# Patient Record
Sex: Female | Born: 1960 | Race: Black or African American | Hispanic: No | Smoking: Never smoker
Health system: Southern US, Community
[De-identification: ages and names within clinical notes are randomized; demographics above are authoritative.]

## PROBLEM LIST (undated history)

## (undated) DIAGNOSIS — F32A Depression, unspecified: Secondary | ICD-10-CM

## (undated) DIAGNOSIS — F329 Major depressive disorder, single episode, unspecified: Secondary | ICD-10-CM

## (undated) DIAGNOSIS — I10 Essential (primary) hypertension: Secondary | ICD-10-CM

## (undated) DIAGNOSIS — K219 Gastro-esophageal reflux disease without esophagitis: Secondary | ICD-10-CM

## (undated) HISTORY — PX: CHOLECYSTECTOMY OPEN: SUR202

---

## 2008-10-17 ENCOUNTER — Ambulatory Visit: Payer: Self-pay | Admitting: Internal Medicine

## 2009-11-14 ENCOUNTER — Ambulatory Visit: Payer: Self-pay | Admitting: Internal Medicine

## 2011-12-06 DIAGNOSIS — G4733 Obstructive sleep apnea (adult) (pediatric): Secondary | ICD-10-CM | POA: Insufficient documentation

## 2011-12-06 DIAGNOSIS — I10 Essential (primary) hypertension: Secondary | ICD-10-CM | POA: Insufficient documentation

## 2011-12-06 DIAGNOSIS — E669 Obesity, unspecified: Secondary | ICD-10-CM | POA: Insufficient documentation

## 2011-12-06 DIAGNOSIS — Z008 Encounter for other general examination: Secondary | ICD-10-CM | POA: Insufficient documentation

## 2011-12-06 DIAGNOSIS — Z1231 Encounter for screening mammogram for malignant neoplasm of breast: Secondary | ICD-10-CM | POA: Insufficient documentation

## 2011-12-10 DIAGNOSIS — E559 Vitamin D deficiency, unspecified: Secondary | ICD-10-CM | POA: Insufficient documentation

## 2012-06-05 ENCOUNTER — Ambulatory Visit: Payer: Self-pay | Admitting: Family Medicine

## 2013-01-30 DIAGNOSIS — F418 Other specified anxiety disorders: Secondary | ICD-10-CM | POA: Insufficient documentation

## 2014-02-19 DIAGNOSIS — J0100 Acute maxillary sinusitis, unspecified: Secondary | ICD-10-CM | POA: Insufficient documentation

## 2014-03-21 ENCOUNTER — Ambulatory Visit: Payer: Self-pay | Admitting: Family Medicine

## 2014-08-21 DIAGNOSIS — J309 Allergic rhinitis, unspecified: Secondary | ICD-10-CM | POA: Insufficient documentation

## 2015-07-03 DIAGNOSIS — D649 Anemia, unspecified: Secondary | ICD-10-CM | POA: Insufficient documentation

## 2015-07-03 DIAGNOSIS — R7303 Prediabetes: Secondary | ICD-10-CM | POA: Insufficient documentation

## 2015-07-03 DIAGNOSIS — R6889 Other general symptoms and signs: Secondary | ICD-10-CM | POA: Insufficient documentation

## 2015-08-07 DIAGNOSIS — M25561 Pain in right knee: Secondary | ICD-10-CM | POA: Insufficient documentation

## 2015-08-15 DIAGNOSIS — R195 Other fecal abnormalities: Secondary | ICD-10-CM | POA: Insufficient documentation

## 2015-08-25 ENCOUNTER — Other Ambulatory Visit: Payer: Self-pay | Admitting: Family Medicine

## 2015-08-25 DIAGNOSIS — Z1231 Encounter for screening mammogram for malignant neoplasm of breast: Secondary | ICD-10-CM

## 2015-09-04 ENCOUNTER — Ambulatory Visit
Admission: RE | Admit: 2015-09-04 | Discharge: 2015-09-04 | Disposition: A | Discharge status: Home / Self Care | Payer: BLUE CROSS/BLUE SHIELD | Source: Ambulatory Visit | Attending: Family Medicine | Admitting: Family Medicine

## 2015-09-04 DIAGNOSIS — Z1231 Encounter for screening mammogram for malignant neoplasm of breast: Secondary | ICD-10-CM | POA: Diagnosis present

## 2015-09-11 ENCOUNTER — Ambulatory Visit: Payer: BLUE CROSS/BLUE SHIELD | Attending: Internal Medicine

## 2015-09-11 DIAGNOSIS — G4733 Obstructive sleep apnea (adult) (pediatric): Secondary | ICD-10-CM | POA: Insufficient documentation

## 2015-10-17 ENCOUNTER — Ambulatory Visit: Admit: 2015-10-17 | Payer: BLUE CROSS/BLUE SHIELD | Admitting: Gastroenterology

## 2015-10-17 SURGERY — COLONOSCOPY WITH PROPOFOL
Anesthesia: General

## 2015-11-26 ENCOUNTER — Other Ambulatory Visit: Payer: Self-pay | Admitting: Student

## 2015-11-26 DIAGNOSIS — M5416 Radiculopathy, lumbar region: Secondary | ICD-10-CM

## 2015-12-13 ENCOUNTER — Ambulatory Visit
Admission: RE | Admit: 2015-12-13 | Discharge: 2015-12-13 | Disposition: A | Discharge status: Home / Self Care | Payer: BLUE CROSS/BLUE SHIELD | Source: Ambulatory Visit | Attending: Student | Admitting: Student

## 2015-12-13 DIAGNOSIS — M5416 Radiculopathy, lumbar region: Secondary | ICD-10-CM | POA: Diagnosis not present

## 2015-12-13 DIAGNOSIS — M5126 Other intervertebral disc displacement, lumbar region: Secondary | ICD-10-CM | POA: Insufficient documentation

## 2015-12-13 DIAGNOSIS — M2578 Osteophyte, vertebrae: Secondary | ICD-10-CM | POA: Insufficient documentation

## 2015-12-13 DIAGNOSIS — M1288 Other specific arthropathies, not elsewhere classified, other specified site: Secondary | ICD-10-CM | POA: Insufficient documentation

## 2015-12-13 DIAGNOSIS — M5136 Other intervertebral disc degeneration, lumbar region: Secondary | ICD-10-CM | POA: Diagnosis not present

## 2015-12-13 DIAGNOSIS — M4806 Spinal stenosis, lumbar region: Secondary | ICD-10-CM | POA: Insufficient documentation

## 2016-01-08 ENCOUNTER — Encounter: Payer: Self-pay | Admitting: *Deleted

## 2016-01-09 ENCOUNTER — Encounter: Payer: Self-pay | Admitting: *Deleted

## 2016-01-09 ENCOUNTER — Encounter
Admission: RE | Disposition: A | Discharge status: Home / Self Care | Payer: Self-pay | Source: Ambulatory Visit | Attending: Gastroenterology

## 2016-01-09 ENCOUNTER — Ambulatory Visit: Payer: BLUE CROSS/BLUE SHIELD | Admitting: Anesthesiology

## 2016-01-09 ENCOUNTER — Ambulatory Visit
Admission: RE | Admit: 2016-01-09 | Discharge: 2016-01-09 | Disposition: A | Discharge status: Home / Self Care | Payer: BLUE CROSS/BLUE SHIELD | Source: Ambulatory Visit | Attending: Gastroenterology | Admitting: Gastroenterology

## 2016-01-09 DIAGNOSIS — I10 Essential (primary) hypertension: Secondary | ICD-10-CM | POA: Insufficient documentation

## 2016-01-09 DIAGNOSIS — K921 Melena: Secondary | ICD-10-CM | POA: Diagnosis not present

## 2016-01-09 DIAGNOSIS — F329 Major depressive disorder, single episode, unspecified: Secondary | ICD-10-CM | POA: Insufficient documentation

## 2016-01-09 DIAGNOSIS — D509 Iron deficiency anemia, unspecified: Secondary | ICD-10-CM | POA: Diagnosis not present

## 2016-01-09 DIAGNOSIS — K219 Gastro-esophageal reflux disease without esophagitis: Secondary | ICD-10-CM | POA: Diagnosis not present

## 2016-01-09 DIAGNOSIS — Z5309 Procedure and treatment not carried out because of other contraindication: Secondary | ICD-10-CM | POA: Insufficient documentation

## 2016-01-09 HISTORY — PX: ESOPHAGOGASTRODUODENOSCOPY (EGD) WITH PROPOFOL: SHX5813

## 2016-01-09 HISTORY — DX: Depression, unspecified: F32.A

## 2016-01-09 HISTORY — PX: COLONOSCOPY WITH PROPOFOL: SHX5780

## 2016-01-09 HISTORY — DX: Gastro-esophageal reflux disease without esophagitis: K21.9

## 2016-01-09 HISTORY — DX: Essential (primary) hypertension: I10

## 2016-01-09 HISTORY — DX: Major depressive disorder, single episode, unspecified: F32.9

## 2016-01-09 SURGERY — COLONOSCOPY WITH PROPOFOL
Anesthesia: General

## 2016-01-09 MED ORDER — SODIUM CHLORIDE 0.9 % IV SOLN
INTRAVENOUS | Status: DC
  Administered 2016-01-09: 1000 mL via INTRAVENOUS

## 2016-01-09 MED ORDER — FENTANYL CITRATE (PF) 100 MCG/2ML IJ SOLN
INTRAMUSCULAR | Status: DC | PRN
  Administered 2016-01-09: 50 ug via INTRAVENOUS

## 2016-01-09 MED ORDER — PROPOFOL 10 MG/ML IV BOLUS
INTRAVENOUS | Status: DC | PRN
  Administered 2016-01-09: 20 mg via INTRAVENOUS
  Administered 2016-01-09: 30 mg via INTRAVENOUS
  Administered 2016-01-09: 20 mg via INTRAVENOUS

## 2016-01-09 MED ORDER — SODIUM CHLORIDE 0.9 % IV SOLN: INTRAVENOUS | Status: DC

## 2016-01-09 MED ORDER — MIDAZOLAM HCL 2 MG/2ML IJ SOLN
INTRAMUSCULAR | Status: DC | PRN
  Administered 2016-01-09: 1 mg via INTRAVENOUS

## 2016-01-09 NOTE — H&P (Addendum)
Outpatient short stay form Pre-procedure 01/09/2016 1:41 PM Sabrina Sails MD  Primary Physician: [facility] family [facility], Dr. Marygrace Drought  Reason for visit:  EGD and colonoscopy  History of present illness:  Patient is a 55 year old female presenting today as above. She has a history of anemia that has been long term however she has noted increased anemia as well as Hemoccult-positive stool after recently increasing NSAID use due to back pain. She has not had a screening colonoscopy in the past. He tolerated her prep well. She takes no aspirin products. She has been taking quite a bit of NSAID recently both diclofenac as well as Naprosyn and currently. She was previously not taking a proton pump inhibitor.  She tolerated her prep well.    Current Facility-Administered Medications:  .  0.9 %  sodium chloride infusion, , Intravenous, Continuous, Sabrina Sails, MD, Last Rate: 20 mL/hr at 01/09/16 1254, 1,000 mL at 01/09/16 1254 .  0.9 %  sodium chloride infusion, , Intravenous, Continuous, Sabrina Sails, MD  Prescriptions Prior to Admission  Medication Sig Dispense Refill Last Dose  . diclofenac (VOLTAREN) 75 MG EC tablet Take 75 mg by mouth 2 (two) times daily.   Past Week at Unknown time  . ergocalciferol (VITAMIN D2) 50000 units capsule Take 50,000 Units by mouth once a week.   Past Week at Unknown time  . ferrous sulfate 325 (65 FE) MG tablet Take 325 mg by mouth daily with breakfast.   Past Week at Unknown time  . guaiFENesin-codeine 100-10 MG/5ML syrup Take 5 mLs by mouth every 6 (six) hours as needed for cough.     Marland Kitchen lisinopril-hydrochlorothiazide (PRINZIDE,ZESTORETIC) 10-12.5 MG tablet Take 1 tablet by mouth daily.   Past Week at Unknown time  . nortriptyline (PAMELOR) 25 MG capsule Take 25 mg by mouth at bedtime.   Past Week at Unknown time  . omeprazole (PRILOSEC) 20 MG capsule Take 20 mg by mouth daily.   Past Week at Unknown time  . polyethylene  glycol-electrolytes (NULYTELY/GOLYTELY) 420 g solution Take 4,000 mLs by mouth once.   01/08/2016 at Unknown time  . sertraline (ZOLOFT) 100 MG tablet Take 100 mg by mouth daily.   Past Week at Unknown time  . naproxen (NAPROSYN) 500 MG tablet Take 500 mg by mouth 2 (two) times daily with a meal.   Not Taking at Unknown time     No Known Allergies   Past Medical History:  Diagnosis Date  . Depression   . GERD (gastroesophageal reflux disease)   . Hypertension     Review of systems:      Physical Exam    Heart and lungs: Regular rate and rhythm without rub or gallop, lungs are bilaterally clear.    HEENT: Normocephalic atraumatic eyes are anicteric    Other:     Pertinant exam for procedure: Soft nontender nondistended bowel sounds positive normoactive.    Planned proceedures: EGD and colonoscopy with indicated procedures. I have discussed the risks benefits and complications of procedures to include not limited to bleeding, infection, perforation and the risk of sedation and the patient wishes to proceed.    Sabrina Sails, MD Gastroenterology 01/09/2016  1:41 PM    Addendum to H&P for procedure today. 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

## 2016-01-09 NOTE — Transfer of Care (Signed)
Immediate Anesthesia Transfer of Care Note  Patient: Sabrina Castro  Procedure(s) Performed: Procedure(s): COLONOSCOPY WITH PROPOFOL (N/A) ESOPHAGOGASTRODUODENOSCOPY (EGD) WITH PROPOFOL (N/A)  Patient Location: PACU  Anesthesia Type:General  Level of Consciousness: awake  Airway & Oxygen Therapy: Patient Spontanous Breathing and Patient connected to nasal cannula oxygen  Post-op Assessment: Report given to RN and Post -op Vital signs reviewed and stable  Post vital signs: Reviewed  Last Vitals:  Vitals:   01/09/16 1236  BP: (!) 161/85  Pulse: 100  Resp: 16  Temp: 36.7 C    Last Pain:  Vitals:   01/09/16 1236  TempSrc: Tympanic         Complications: No apparent anesthesia complications

## 2016-01-09 NOTE — Op Note (Signed)
[facility] Gastroenterology Patient Name: Sabrina Castro Procedure Date: 01/09/2016 1:39 PM MRN: [id number] Account #: [id number] Date of Birth: [birth year] Admit Type: Outpatient Age: 55 Room: [facility] ENDO ROOM 1 Gender: Female Note Status: Finalized Procedure:            Upper GI endoscopy Providers:            Lollie Sails, MD Referring MD:         Tania Ade (Referring MD) Complications:        No immediate complications. Procedure:            Pre-Anesthesia Assessment:                       - ASA Grade Assessment: III - A patient with severe                        systemic disease.                       After obtaining informed consent, the endoscope was                        passed under direct vision. Throughout the procedure,                        the patient's blood pressure, pulse, and oxygen                        saturations were monitored continuously. The procedure                        was aborted. The scope was not inserted. Medications                        were given. The upper GI endoscopy was extremely                        difficult due to patient intolerance of esophageal                        intubation and ineffective sedation. Findings:      multiple attempts were done to introduce the scope into the esophagus,       however with each attempt, patient was intolerant of introduction,       coughing and gagging, and further increase of anesthesia was deemed to       be high risk for the patient. Impression:           - No specimens collected.                       - The procedure was aborted due to patient intolerance                        of esophageal intubation. Recommendation:       - Perform an upper GI series and small bowel follow                        through at appointment to be scheduled.                       -  No aspirin, ibuprofen, naproxen, or other                        non-steroidal anti-inflammatory  drugs.                       - Use Protonix (pantoprazole) 40 mg PO daily daily. Diagnosis Code(s):    --- Professional ---                       Z53.8, Procedure and treatment not carried out for                        other reasons Lollie Sails, MD 01/09/2016 2:34:28 PM This report has been signed electronically. Number of Addenda: 0 Note Initiated On: 01/09/2016 1:39 PM      [facility]

## 2016-01-09 NOTE — Op Note (Signed)
[facility] Gastroenterology Patient Name: Sabrina Castro Procedure Date: 01/09/2016 1:38 PM MRN: [id number] Account #: [id number] Date of Birth: April 25, 1961 Admit Type: Outpatient Age: 55 Room: [facility] ENDO ROOM 1 Gender: Female Note Status: Finalized Procedure:            Colonoscopy Indications:          Heme positive stool, Iron deficiency anemia Providers:            Lollie Sails, MD Referring MD:         Tania Ade (Referring MD) Medicines:            Monitored Anesthesia Care Complications:        No immediate complications. Procedure:            Pre-Anesthesia Assessment:                       - ASA Grade Assessment: III - A patient with severe                        systemic disease.                       After obtaining informed consent, the colonoscope was                        passed under direct vision. Throughout the procedure,                        the patient's blood pressure, pulse, and oxygen                        saturations were monitored continuously. The                        Colonoscope was introduced through the anus with the                        intention of advancing to the cecum. The scope was                        advanced to the hepatic flexure before the procedure                        was aborted. Medications were given. The colonoscopy                        was unusually difficult due to significant looping, the                        patient's body habitus and the patient's discomfort                        during the procedure. The patient tolerated the                        procedure. The quality of the bowel preparation was                        fair. Findings:      The sigmoid colon and transverse colon revealed significantly excessive  looping. Due to body habitus and h/o back pain, unable to change       position on the table or use abdominal support.      The exam was otherwise without abnormality  to the region of the hepatic       flexure. Impression:           - There was significant looping of the colon.                       - The examination was otherwise normal.                       - No specimens collected. Recommendation:       - Discharge patient to home.                       - Perform a CT scan (computed tomography) of abdomen                        with contrast and pelvis with contrast and CT                        colonography at appointment to be scheduled. Procedure Code(s):    --- Professional ---                       [phone], 3, Colonoscopy, flexible; diagnostic, including                        collection of specimen(s) by brushing or washing, when                        performed (separate procedure) Diagnosis Code(s):    --- Professional ---                       R19.5, Other fecal abnormalities                       D50.9, Iron deficiency anemia, unspecified CPT copyright 2016 American Medical Association. All rights reserved. The codes documented in this report are preliminary and upon coder review may  be revised to meet current compliance requirements. Lollie Sails, MD 01/09/2016 2:26:52 PM This report has been signed electronically. Number of Addenda: 0 Note Initiated On: 01/09/2016 1:38 PM Total Procedure Duration: 0 hours 16 minutes 28 seconds       [facility]

## 2016-01-09 NOTE — Anesthesia Preprocedure Evaluation (Signed)
Anesthesia Evaluation  Patient identified by MRN, date of birth, ID band Patient awake    Reviewed: Allergy & Precautions, NPO status   Airway Mallampati: II       Dental  (+) Partial Upper, Upper Dentures   Pulmonary     + decreased breath sounds      Cardiovascular Exercise Tolerance: Good hypertension, Pt. on medications  Rhythm:Regular     Neuro/Psych Depression    GI/Hepatic Neg liver ROS, GERD  Medicated,  Endo/Other  Morbid obesity  Renal/GU      Musculoskeletal   Abdominal (+) + obese,   Peds  Hematology negative hematology ROS (+)   Anesthesia Other Findings   Reproductive/Obstetrics                             Anesthesia Physical Anesthesia Plan  ASA: III  Anesthesia Plan: General   Post-op Pain Management:    Induction: Intravenous  Airway Management Planned: Natural Airway and Nasal Cannula  Additional Equipment:   Intra-op Plan:   Post-operative Plan:   Informed Consent: I have reviewed the patients History and Physical, chart, labs and discussed the procedure including the risks, benefits and alternatives for the proposed anesthesia with the patient or authorized representative who has indicated his/her understanding and acceptance.     Plan Discussed with:   Anesthesia Plan Comments:         Anesthesia Quick Evaluation

## 2016-01-12 ENCOUNTER — Encounter: Payer: Self-pay | Admitting: Gastroenterology

## 2016-01-13 ENCOUNTER — Encounter: Payer: Self-pay | Admitting: Gastroenterology

## 2016-01-14 NOTE — Anesthesia Postprocedure Evaluation (Signed)
Anesthesia Post Note  Patient: Sabrina Castro  Procedure(s) Performed: Procedure(s) (LRB): COLONOSCOPY WITH PROPOFOL (N/A) ESOPHAGOGASTRODUODENOSCOPY (EGD) WITH PROPOFOL (N/A)  Patient location during evaluation: PACU Anesthesia Type: General Level of consciousness: awake Pain management: pain level controlled Vital Signs Assessment: post-procedure vital signs reviewed and stable Respiratory status: spontaneous breathing Cardiovascular status: stable Anesthetic complications: no    Last Vitals:  Vitals:   01/09/16 1446 01/09/16 1456  BP: 125/81 138/81  Pulse: 95 91  Resp: 16 (!) 25  Temp:      Last Pain:  Vitals:   01/11/16 1144  TempSrc:   PainSc: 0-No pain                 VAN STAVEREN,Wynne Jury

## 2016-02-17 ENCOUNTER — Other Ambulatory Visit: Payer: Self-pay | Admitting: Gastroenterology

## 2016-02-17 DIAGNOSIS — R195 Other fecal abnormalities: Secondary | ICD-10-CM

## 2016-02-17 DIAGNOSIS — D509 Iron deficiency anemia, unspecified: Secondary | ICD-10-CM

## 2016-02-21 ENCOUNTER — Other Ambulatory Visit: Payer: Self-pay | Admitting: Gastroenterology

## 2016-02-21 DIAGNOSIS — D509 Iron deficiency anemia, unspecified: Secondary | ICD-10-CM

## 2016-02-21 DIAGNOSIS — R195 Other fecal abnormalities: Secondary | ICD-10-CM

## 2017-04-28 ENCOUNTER — Emergency Department
Admission: EM | Admit: 2017-04-28 | Discharge: 2017-04-28 | Disposition: A | Discharge status: Home / Self Care | Payer: BLUE CROSS/BLUE SHIELD | Attending: Emergency Medicine | Admitting: Emergency Medicine

## 2017-04-28 ENCOUNTER — Other Ambulatory Visit: Payer: Self-pay

## 2017-04-28 ENCOUNTER — Emergency Department: Payer: BLUE CROSS/BLUE SHIELD

## 2017-04-28 ENCOUNTER — Encounter: Payer: Self-pay | Admitting: Emergency Medicine

## 2017-04-28 DIAGNOSIS — I10 Essential (primary) hypertension: Secondary | ICD-10-CM | POA: Insufficient documentation

## 2017-04-28 DIAGNOSIS — Z79899 Other long term (current) drug therapy: Secondary | ICD-10-CM | POA: Diagnosis not present

## 2017-04-28 DIAGNOSIS — R000 Tachycardia, unspecified: Secondary | ICD-10-CM | POA: Diagnosis present

## 2017-04-28 DIAGNOSIS — I471 Supraventricular tachycardia: Secondary | ICD-10-CM | POA: Insufficient documentation

## 2017-04-28 LAB — URINALYSIS, COMPLETE (UACMP) WITH MICROSCOPIC
Bilirubin Urine: NEGATIVE
Glucose, UA: NEGATIVE mg/dL
Hgb urine dipstick: NEGATIVE
Ketones, ur: NEGATIVE mg/dL
Leukocytes, UA: NEGATIVE
Nitrite: NEGATIVE
PROTEIN: NEGATIVE mg/dL
SPECIFIC GRAVITY, URINE: 1.008 (ref 1.005–1.030)
pH: 7 (ref 5.0–8.0)

## 2017-04-28 LAB — CBC
HEMATOCRIT: 35.9 % (ref 35.0–47.0)
Hemoglobin: 11.7 g/dL — ABNORMAL LOW (ref 12.0–16.0)
MCH: 26.1 pg (ref 26.0–34.0)
MCHC: 32.6 g/dL (ref 32.0–36.0)
MCV: 80.1 fL (ref 80.0–100.0)
Platelets: 410 10*3/uL (ref 150–440)
RBC: 4.48 MIL/uL (ref 3.80–5.20)
RDW: 16.7 % — AB (ref 11.5–14.5)
WBC: 13.2 10*3/uL — ABNORMAL HIGH (ref 3.6–11.0)

## 2017-04-28 LAB — BASIC METABOLIC PANEL
Anion gap: 13 (ref 5–15)
BUN: 13 mg/dL (ref 6–20)
CHLORIDE: 104 mmol/L (ref 101–111)
CO2: 22 mmol/L (ref 22–32)
Calcium: 9.8 mg/dL (ref 8.9–10.3)
Creatinine, Ser: 0.87 mg/dL (ref 0.44–1.00)
GFR calc Af Amer: >60 mL/min (ref >60)
GFR calc non Af Amer: >60 mL/min (ref >60)
GLUCOSE: 157 mg/dL — AB (ref 65–99)
POTASSIUM: 3.5 mmol/L (ref 3.5–5.1)
Sodium: 139 mmol/L (ref 135–145)

## 2017-04-28 LAB — TROPONIN I: Troponin I: <0.03 ng/mL (ref <0.03)

## 2017-04-28 MED ORDER — ADENOSINE 6 MG/2ML IV SOLN
INTRAVENOUS | Status: AC
  Administered 2017-04-28: 6 mg via INTRAVENOUS
  Filled 2017-04-28: qty 2

## 2017-04-28 MED ORDER — ADENOSINE 12 MG/4ML IV SOLN
INTRAVENOUS | Status: AC
  Filled 2017-04-28: qty 4

## 2017-04-28 MED ORDER — ADENOSINE 6 MG/2ML IV SOLN
6.0000 mg | Freq: Once | INTRAVENOUS | Status: AC
  Administered 2017-04-28: 6 mg via INTRAVENOUS

## 2017-04-28 NOTE — ED Triage Notes (Addendum)
Pt reports she first noticed heart racing, but states she hasn't been feeling well for the past several days. Pt states she had an allergic reaction over the weekend and states she has been feeling weak

## 2017-04-28 NOTE — Discharge Instructions (Signed)
You are evaluated for fatigue, and although no certain cause was found for that your exam and evaluation are overall reassured today.  For the heart racing you were found to have heart rhythm called SVT and was given adenosine medication here in the ER which did convert back to normal heart rhythm.  Avoid caffeine, any other cardiac stimulants including weight loss medications, or cough syrups.  Return to emergency department immediately for any worsening condition including fever, heart racing, chest pain, trouble breathing, shortness of breath, dizziness or passing out, weakness, numbness, or any other symptoms concerning to you.

## 2017-04-28 NOTE — ED Notes (Signed)
Patient transported to X-ray 

## 2017-04-28 NOTE — ED Provider Notes (Signed)
[facility] Emergency Department Provider Note ____________________________________________   I have reviewed the triage vital signs and the triage nursing note.  HISTORY  Chief Complaint Tachycardia   Historian Patient  HPI Sabrina Castro is a 56 y.o. female arrived with heart racing and sense of fullness this morning, an hour or so prior to arrival after she had arrived at work.  She states that for the past 1 week she has just felt a little under the weather and fatigue.  She saw urgent care couple days ago and states she had blood work and told there was nothing particular that was found.  She is never had irregular heartbeat before.  She does drink coffee, but states just 1 or 2 cups/day.  She is not on any herbal remedies that should cause cardiac effects.  Denies shortness of breath or fevers or coughing.  No chest pain.   Past Medical History:  Diagnosis Date  . Depression   . GERD (gastroesophageal reflux disease)   . Hypertension     There are no active problems to display for this patient.   Past Surgical History:  Procedure Laterality Date  . COLONOSCOPY WITH PROPOFOL N/A 01/09/2016   Procedure: COLONOSCOPY WITH PROPOFOL;  Surgeon: Lollie Sails, MD;  Location[facility] ENDOSCOPY;  Service: Endoscopy;  Laterality: N/A;  . ESOPHAGOGASTRODUODENOSCOPY (EGD) WITH PROPOFOL N/A 01/09/2016   Procedure: ESOPHAGOGASTRODUODENOSCOPY (EGD) WITH PROPOFOL;  Surgeon: Lollie Sails, MD;  Location: [facility] ENDOSCOPY;  Service: Endoscopy;  Laterality: N/A;    Prior to Admission medications   Medication Sig Start Date End Date Taking? Authorizing Provider  ergocalciferol (VITAMIN D2) 50000 units capsule Take 50,000 Units by mouth once a week.   Yes [provider]  etodolac (LODINE) 500 MG tablet Take 500 mg by mouth 2 (two) times daily. 04/27/16  Yes [provider]  ferrous sulfate 325 (65 FE) MG tablet Take 325 mg by mouth daily with  breakfast.   Yes [provider]  gabapentin (NEURONTIN) 300 MG capsule Take 300 mg by mouth 3 (three) times daily. 03/17/16  Yes [provider]  Multiple Vitamin (MULTIVITAMIN) tablet Take 1 tablet by mouth daily.   Yes [provider]  nortriptyline (PAMELOR) 25 MG capsule Take 25 mg by mouth at bedtime.   Yes [provider]  pantoprazole (PROTONIX) 40 MG tablet Take 40 mg by mouth daily.   Yes [provider]  sertraline (ZOLOFT) 100 MG tablet Take 100 mg by mouth daily.   Yes [provider]  guaiFENesin-codeine 100-10 MG/5ML syrup Take 5 mLs by mouth every 6 (six) hours as needed for cough.    [provider]  lisinopril-hydrochlorothiazide (PRINZIDE,ZESTORETIC) 10-12.5 MG tablet Take 1 tablet by mouth daily.    [provider]    Allergies  Allergen Reactions  . Lisinopril Hives    Cough - pt told to stop taking 04/2017    Family History  Problem Relation Age of Onset  . Breast cancer Maternal Aunt     Social History Social History   Tobacco Use  . Smoking status: Never Smoker  . Smokeless tobacco: Never Used  Substance Use Topics  . Alcohol use: No  . Drug use: No    Review of Systems  Constitutional: Negative for fever. Eyes: Negative for visual changes. ENT: Negative for sore throat. Cardiovascular: Negative for chest pain.  Since of fullness. Respiratory: Negative for shortness of breath. Gastrointestinal: Negative for abdominal pain, vomiting and diarrhea. Genitourinary: Negative  for dysuria. Musculoskeletal: Negative for back pain. Skin: Negative for rash. Neurological: Negative for headache.  ____________________________________________   PHYSICAL EXAM:  VITAL SIGNS: ED Triage Vitals  Enc Vitals Group     BP      Pulse      Resp      Temp      Temp src      SpO2      Weight      Height      Head Circumference      Peak Flow      Pain Score      Pain Loc      Pain  Edu?      Excl. in [place]?      Constitutional: Alert and oriented. Well appearing and in no distress. HEENT   Head: Normocephalic and atraumatic.      Eyes: Conjunctivae are normal. Pupils equal and round.       Ears:         Nose: No congestion/rhinnorhea.   Mouth/Throat: Mucous membranes are moist.   Neck: No stridor. Cardiovascular/Chest: Regular and tachycardic.  No murmurs, rubs, or gallops. Respiratory: Normal respiratory effort without tachypnea nor retractions. Breath sounds are clear and equal bilaterally. No wheezes/rales/rhonchi. Gastrointestinal: Soft. No distention, no guarding, no rebound. Nontender.  Morbidly obese. Genitourinary/rectal:Deferred Musculoskeletal: Nontender with normal range of motion in all extremities. No joint effusions.  No lower extremity tenderness.  No edema. Neurologic:  Normal speech and language. No gross or focal neurologic deficits are appreciated. Skin:  Skin is warm, dry and intact. No rash noted. Psychiatric: Mood and affect are normal. Speech and behavior are normal. Patient exhibits appropriate insight and judgment.   ____________________________________________  LABS (pertinent positives/negatives) I, Lisa Roca, MD the attending physician have reviewed the labs noted below.  Labs Reviewed  BASIC METABOLIC PANEL - Abnormal; Notable for the following components:      Result Value   Glucose, Bld 157 (*)    All other components within normal limits  CBC - Abnormal; Notable for the following components:   WBC 13.2 (*)    Hemoglobin 11.7 (*)    RDW 16.7 (*)    All other components within normal limits  URINALYSIS, COMPLETE (UACMP) WITH MICROSCOPIC - Abnormal; Notable for the following components:   Color, Urine STRAW (*)    APPearance CLEAR (*)    Bacteria, UA RARE (*)    Squamous Epithelial / LPF 0-5 (*)    All other components within normal limits  TROPONIN I  POC URINE PREG, ED     ____________________________________________    EKG I, Lisa Roca, MD, the attending physician have personally viewed and interpreted all ECGs.  189 bpm.  Regular tachycardia.  Left axis deviation.  Nonspecific ST and T wave  Repeat EKG 120 bpm.  Sinus tachycardia.  Nonspecific intraventricular conduction delay.  Questionable LVH.  Nonspecific ST and T wave ____________________________________________  RADIOLOGY All Xrays were viewed by me.  Imaging interpreted by Radiologist, and I, Lisa Roca, MD the attending physician have reviewed the radiologist interpretation noted below.  Chest x-ray two-view:  FINDINGS: Mild cardiac enlargement without heart failure. Lungs are clear without infiltrate effusion or mass.  IMPRESSION: No active cardiopulmonary disease.  __________________________________________  PROCEDURES  Procedure(s) performed: None  Critical Care performed: None   ____________________________________________  ED COURSE / ASSESSMENT AND PLAN  Pertinent labs & imaging results that were available during my care of the patient were reviewed by me and considered  in my medical decision making (see chart for details).    In triage patient EKG found to show rhythm consistent with likely SVT.  Patient was placed on cardiac pads and prepared with 2 IVs for adenosine cardioversion.  After discussing with the patient, patient was given adenosine x1 with resultant conversion to normal sinus rhythm, actually sac sinus tachycardia around 120 which then did resolve as well.  Uncertain inciting factor.  This is never happened to her before.  We discussed avoiding anything that could precipitate adrenergic drive.  In terms of her complaint of just feeling fatigued over the past 1 week, she was seen at urgent care apparently, so her heart rate was probably normal at that point time.  Her laboratory studies are all reassuring today in terms of no elect light  disturbance, acute renal failure, acute anemia, elevated white blood cell count, etc.  We discussed reassuring evaluation.  I will go ahead and refer her for cardiology mostly just based on risk factor including her morbid obesity, and we discussed return precautions.    CONSULTATIONS: None   Patient / Family / Caregiver informed of clinical course, medical decision-making process, and agree with plan.   I discussed return precautions, follow-up instructions, and discharge instructions with patient and/or family.  Discharge Instructions : You are evaluated for fatigue, and although no certain cause was found for that your exam and evaluation are overall reassured today.  For the heart racing you were found to have heart rhythm called SVT and was given adenosine medication here in the ER which did convert back to normal heart rhythm.  Avoid caffeine, any other cardiac stimulants including weight loss medications, or cough syrups.  Return to emergency department immediately for any worsening condition including fever, heart racing, chest pain, trouble breathing, shortness of breath, dizziness or passing out, weakness, numbness, or any other symptoms concerning to you.    ___________________________________________   FINAL CLINICAL IMPRESSION(S) / ED DIAGNOSES   Final diagnoses:  SVT (supraventricular tachycardia) ([place])      ___________________________________________        Note: This dictation was prepared with Dragon dictation. Any transcriptional errors that result from this process are unintentional    Lisa Roca, MD 04/28/17 1311

## 2017-04-28 NOTE — ED Notes (Signed)
Sandwich tray given at this time 

## 2017-05-11 DIAGNOSIS — Z6841 Body Mass Index (BMI) 40.0 and over, adult: Secondary | ICD-10-CM

## 2017-05-11 DIAGNOSIS — E119 Type 2 diabetes mellitus without complications: Secondary | ICD-10-CM | POA: Insufficient documentation

## 2017-05-11 DIAGNOSIS — M159 Polyosteoarthritis, unspecified: Secondary | ICD-10-CM | POA: Insufficient documentation

## 2017-05-11 DIAGNOSIS — Z87898 Personal history of other specified conditions: Secondary | ICD-10-CM | POA: Insufficient documentation

## 2017-06-29 DIAGNOSIS — Z6841 Body Mass Index (BMI) 40.0 and over, adult: Secondary | ICD-10-CM

## 2017-06-30 ENCOUNTER — Other Ambulatory Visit: Payer: Self-pay | Admitting: Internal Medicine

## 2017-06-30 DIAGNOSIS — Z1231 Encounter for screening mammogram for malignant neoplasm of breast: Secondary | ICD-10-CM

## 2017-07-13 DIAGNOSIS — R202 Paresthesia of skin: Secondary | ICD-10-CM

## 2017-07-13 DIAGNOSIS — R200 Anesthesia of skin: Secondary | ICD-10-CM | POA: Insufficient documentation

## 2017-07-13 DIAGNOSIS — R700 Elevated erythrocyte sedimentation rate: Secondary | ICD-10-CM | POA: Insufficient documentation

## 2017-07-13 DIAGNOSIS — M5136 Other intervertebral disc degeneration, lumbar region: Secondary | ICD-10-CM | POA: Insufficient documentation

## 2017-08-25 DIAGNOSIS — F329 Major depressive disorder, single episode, unspecified: Secondary | ICD-10-CM | POA: Insufficient documentation

## 2017-08-25 DIAGNOSIS — F32A Depression, unspecified: Secondary | ICD-10-CM | POA: Insufficient documentation

## 2017-09-13 ENCOUNTER — Ambulatory Visit: Payer: BLUE CROSS/BLUE SHIELD | Attending: Nurse Practitioner | Admitting: Nurse Practitioner

## 2017-09-13 ENCOUNTER — Encounter: Payer: Self-pay | Admitting: Nurse Practitioner

## 2017-09-13 ENCOUNTER — Ambulatory Visit
Admission: RE | Admit: 2017-09-13 | Discharge: 2017-09-13 | Disposition: A | Discharge status: Home / Self Care | Payer: BLUE CROSS/BLUE SHIELD | Source: Ambulatory Visit | Attending: Nurse Practitioner | Admitting: Nurse Practitioner

## 2017-09-13 ENCOUNTER — Other Ambulatory Visit: Payer: Self-pay

## 2017-09-13 DIAGNOSIS — Z6841 Body Mass Index (BMI) 40.0 and over, adult: Secondary | ICD-10-CM | POA: Diagnosis not present

## 2017-09-13 DIAGNOSIS — Z79899 Other long term (current) drug therapy: Secondary | ICD-10-CM

## 2017-09-13 DIAGNOSIS — K219 Gastro-esophageal reflux disease without esophagitis: Secondary | ICD-10-CM | POA: Diagnosis not present

## 2017-09-13 DIAGNOSIS — M533 Sacrococcygeal disorders, not elsewhere classified: Secondary | ICD-10-CM

## 2017-09-13 DIAGNOSIS — E1142 Type 2 diabetes mellitus with diabetic polyneuropathy: Secondary | ICD-10-CM | POA: Insufficient documentation

## 2017-09-13 DIAGNOSIS — F329 Major depressive disorder, single episode, unspecified: Secondary | ICD-10-CM | POA: Diagnosis not present

## 2017-09-13 DIAGNOSIS — D649 Anemia, unspecified: Secondary | ICD-10-CM | POA: Diagnosis not present

## 2017-09-13 DIAGNOSIS — M25562 Pain in left knee: Secondary | ICD-10-CM | POA: Insufficient documentation

## 2017-09-13 DIAGNOSIS — Z789 Other specified health status: Secondary | ICD-10-CM | POA: Diagnosis not present

## 2017-09-13 DIAGNOSIS — M25551 Pain in right hip: Secondary | ICD-10-CM | POA: Diagnosis not present

## 2017-09-13 DIAGNOSIS — M79605 Pain in left leg: Secondary | ICD-10-CM | POA: Insufficient documentation

## 2017-09-13 DIAGNOSIS — M25561 Pain in right knee: Secondary | ICD-10-CM | POA: Insufficient documentation

## 2017-09-13 DIAGNOSIS — M5441 Lumbago with sciatica, right side: Secondary | ICD-10-CM | POA: Diagnosis not present

## 2017-09-13 DIAGNOSIS — R000 Tachycardia, unspecified: Secondary | ICD-10-CM | POA: Insufficient documentation

## 2017-09-13 DIAGNOSIS — M899 Disorder of bone, unspecified: Secondary | ICD-10-CM | POA: Diagnosis not present

## 2017-09-13 DIAGNOSIS — Z5181 Encounter for therapeutic drug level monitoring: Secondary | ICD-10-CM | POA: Diagnosis not present

## 2017-09-13 DIAGNOSIS — F419 Anxiety disorder, unspecified: Secondary | ICD-10-CM | POA: Insufficient documentation

## 2017-09-13 DIAGNOSIS — G629 Polyneuropathy, unspecified: Secondary | ICD-10-CM | POA: Insufficient documentation

## 2017-09-13 DIAGNOSIS — M5136 Other intervertebral disc degeneration, lumbar region: Secondary | ICD-10-CM | POA: Diagnosis not present

## 2017-09-13 DIAGNOSIS — M79604 Pain in right leg: Secondary | ICD-10-CM | POA: Diagnosis not present

## 2017-09-13 DIAGNOSIS — M25552 Pain in left hip: Secondary | ICD-10-CM | POA: Insufficient documentation

## 2017-09-13 DIAGNOSIS — G894 Chronic pain syndrome: Secondary | ICD-10-CM | POA: Diagnosis present

## 2017-09-13 DIAGNOSIS — M24852 Other specific joint derangements of left hip, not elsewhere classified: Secondary | ICD-10-CM | POA: Insufficient documentation

## 2017-09-13 DIAGNOSIS — Z79891 Long term (current) use of opiate analgesic: Secondary | ICD-10-CM | POA: Diagnosis not present

## 2017-09-13 DIAGNOSIS — G8929 Other chronic pain: Secondary | ICD-10-CM | POA: Insufficient documentation

## 2017-09-13 DIAGNOSIS — M170 Bilateral primary osteoarthritis of knee: Secondary | ICD-10-CM | POA: Diagnosis not present

## 2017-09-13 DIAGNOSIS — M24851 Other specific joint derangements of right hip, not elsewhere classified: Secondary | ICD-10-CM | POA: Insufficient documentation

## 2017-09-13 DIAGNOSIS — M5442 Lumbago with sciatica, left side: Secondary | ICD-10-CM | POA: Diagnosis not present

## 2017-09-13 DIAGNOSIS — G4733 Obstructive sleep apnea (adult) (pediatric): Secondary | ICD-10-CM | POA: Insufficient documentation

## 2017-09-13 DIAGNOSIS — E559 Vitamin D deficiency, unspecified: Secondary | ICD-10-CM | POA: Diagnosis not present

## 2017-09-13 NOTE — Progress Notes (Signed)
Patient's Name: Sabrina Castro  MRN: [id number]  Referring Provider: Tracie Harrier, MD  DOB: [birth year]  PCP: Tracie Harrier, MD  DOS: 09/13/2017  Note by: Dionisio David NP  Service setting: Ambulatory outpatient  Specialty: Interventional Pain Management  Location: [facility] (AMB) Pain Management Facility    Patient type: New Patient    Primary Reason(s) for Visit: Initial Patient Evaluation CC: Back Pain (lower)  HPI  [name] is a 57 y.o. year old, female patient, who comes today for an initial evaluation. She has Acute maxillary sinusitis, unspecified; Allergic rhinitis; Anemia, unspecified; DDD (degenerative disc disease), lumbar; Depression; Elevated erythrocyte sedimentation rate; Encounter for other general examination; Encounter for screening mammogram for malignant neoplasm of breast; Essential (primary) hypertension; Generalized osteoarthritis of multiple sites; GERD (gastroesophageal reflux disease); History of tachycardia; Low back pain; Other dorsalgia; Morbid obesity with BMI of 50.0-59.9, adult ([place]); Obesity, unspecified; Numbness and tingling of foot; Obstructive sleep apnea; Other fecal abnormalities; Other general symptoms and signs; Other specified anxiety disorders; Pain in joint; Pain in right knee; Polyneuropathy; Prediabetes; Tachycardia; Type 2 diabetes mellitus without complication, without long-term current use of insulin ([place]); Vitamin D deficiency, unspecified; Morbid obesity with BMI of 40.0-44.9, adult ([place]); Chronic bilateral low back pain with bilateral sciatica (Primary Area of Pain)(Midline)  (L>R); Chronic pain of both hips (Secondary Area of Pain) (L>R); Chronic pain of both lower extremities  (Tertiary Area of Pain)(L>R); Chronic pain of both knees (Fourth Area of Pain)  (R>L); Chronic pain syndrome; Long term current use of opiate analgesic; Pharmacologic therapy; Disorder of skeletal system; Problems influencing health status; and Chronic sacroiliac joint pain on  their problem list.. Her primarily concern today is the Back Pain (lower)  Pain Assessment: Location: Lower, Right, Left Back Radiating: radiates down front and back of both legs to ankles; independent of that, pt is experiencing pain in her feet she says is r/t bone spurs Onset: More than a month ago Duration: Chronic pain Quality: Constant, Aching Severity: 9 /10 (self-reported pain score)  Note: Reported level is compatible with observation. Clinically the patient looks like a 3/10 A 3/10 is viewed as "Moderate" and described as significantly interfering with activities of daily living (ADL). It becomes difficult to feed, bathe, get dressed, get on and off the toilet or to perform personal hygiene functions. Difficult to get in and out of bed or a chair without assistance. Very distracting. With effort, it can be ignored when deeply involved in activities. Information on the proper use of the pain scale provided to the patient today. When using our objective Pain Scale, levels between 6 and 10/10 are said to belong in an emergency room, as it progressively worsens from a 6/10, described as severely limiting, requiring emergency care not usually available at an outpatient pain management facility. At a 6/10 level, communication becomes difficult and requires great effort. Assistance to reach the emergency department may be required. Facial flushing and profuse sweating along with potentially dangerous increases in heart rate and blood pressure will be evident. Effect on ADL: "I don't move unless I absolutely have to". Timing: Constant Modifying factors: medications, seeing a chiropractor, biofreeze, TENS device  Onset and Duration: Present longer than 3 months Cause of pain: Unknown Severity: Getting worse, NAS-11 at its worse: 10/10, NAS-11 at its best: 7/10, NAS-11 now: 10/10 and NAS-11 on the average: 10/10 Timing: Morning, Night, During activity or exercise and After a period of  immobility Aggravating Factors: Bending, Climbing, Kneeling, Lifiting, Motion, Prolonged sitting, Prolonged standing,  Squatting, Twisting, Walking, Walking uphill, Walking downhill and Working Alleviating Factors: Sleeping, TENS and Chiropractic manipulations Associated Problems: Night-time cramps, Depression, Fatigue, Inability to concentrate, Numbness, Sadness, Spasms, Swelling, Tingling, Pain that wakes patient up and Pain that does not allow patient to sleep Quality of Pain: Aching, Agonizing, Annoying, Constant, Cramping, Deep, Disabling, Distressing, Fearful, Feeling of weight, Getting longer, Horrible, Nagging, Pressure-like, Punishing, Stabbing, Throbbing, Tingling and Uncomfortable Previous Examinations or Tests: EMG/PNCV, MRI scan, Nerve block, X-rays, Orthopedic evaluation and Chiropractic evaluation Previous Treatments: Chiropractic manipulations and TENS  The patient comes into the clinics today for the first time for a chronic pain management evaluation. According to the patient her primary area of pain is in her lower back. She admits this is been progressing over the last year. She describes the pain is in midline. She denies any previous surgeries however has been seen by neurosurgeon Dr. Lacinda Axon no surgery at this time. She admits that she was seen by Dr. Sharlet Salina for epidural steroid injections which were somewhat effective. She denies any physical therapy. She admits that she has been seen by chiropractor in the past however the pain is getting worse.  Her second area of pain is in her lower extremities. She admits that the left side is greater than the right. She describes the pain as being in both of her hips. She admits that the pain goes down the back of her leg to the bottom of her heel. She describes as a burning type pain. She also admits that the pain does go down the outer portion of her legs. She has had EMG however secondary to patient's size unable to reach muscles.  Her  second area of pain is in her knees. She admits that the right is greater than the left. She denies any previous surgery. She has had Synvisc injections in the past. She has had physical therapy but states that it was not effective.  Today I took the time to provide the patient with information regarding this pain practice. The patient was informed that the practice is divided into two sections: an interventional pain management section, as well as a completely separate and distinct medication management section. I explained that there are procedure days for interventional therapies, and evaluation days for follow-ups and medication management. Because of the amount of documentation required during both, they are kept separated. This means that there is the possibility that she may be scheduled for a procedure on one day, and medication management the next. I have also informed her that because of staffing and facility limitations, this practice will no longer take patients for medication management only. To illustrate the reasons for this, I gave the patient the example of surgeons, and how inappropriate it would be to refer a patient to his/her care, just to write for the post-surgical antibiotics on a surgery done by a different surgeon.   Because interventional pain management is part of the board-certified specialty for the doctors, the patient was informed that joining this practice means that they are open to any and all interventional therapies. I made it clear that this does not mean that they will be forced to have any procedures done. What this means is that I believe interventional therapies to be essential part of the diagnosis and proper management of chronic pain conditions. Therefore, patients not interested in these interventional alternatives will be better served under the care of a different practitioner.  The patient was also made aware of my Comprehensive Pain Management  Safety Guidelines  where by joining this practice, they limit all of their nerve blocks and joint injections to those done by our practice, for as long as we are retained to manage their care. Historic Controlled Substance Pharmacotherapy Review  PMP and historical list of controlled substances: tramadol 50 mg, phentermine 37.5 mg, Virtussin syrup, hydrocodone/acetaminophen 5/325 mg, guaifenesin with codeine syrup hydrocodone/homatropine syrup Highest opioid analgesic regimen found: tramadol 50 mg 4 times a day (last fill date 08/15/2017) tramadol 200 mg per day Most recent opioid analgesic:  tramadol 50 mg twice daily (last fill date 08/15/2017) tramadol 100 mg per day Current opioid analgesics: tramadol 50 mg twice daily (last fill date 08/15/2017) tramadol 100 mg per day Highest recorded MME/day:  20 mg/day MME/day: 41m/day Medications: The patient did not bring the medication(s) to the appointment, as requested in our "New Patient Package" Pharmacodynamics: Desired effects: Analgesia: The patient reports >50% benefit. Reported improvement in function: The patient reports medication allows her to accomplish basic ADLs. Clinically meaningful improvement in function (CMIF): Sustained CMIF goals met Perceived effectiveness: Described as relatively effective, allowing for increase in activities of daily living (ADL) Undesirable effects: Side-effects or Adverse reactions: None reported Historical Monitoring: The patient  reports that she does not use drugs. List of all UDS Test(s): No results found for: MDMA, COCAINSCRNUR, PCPSCRNUR, PCPQUANT, CANNABQUANT, THCU, EBremenList of all Serum Drug Screening Test(s):  No results found for: AMPHSCRSER, BARBSCRSER, BENZOSCRSER, COCAINSCRSER, PCPSCRSER, PCPQUANT, THCSCRSER, CANNABQUANT, OPIATESCRSER, OXYSCRSER, PROPOXSCRSER Historical Background Evaluation: [place] PDMP: Six (6) year initial data search conducted.             [place] Department of public safety, offender search:  (Editor, commissioningInformation) Non-contributory Risk Assessment Profile: Aberrant behavior: None observed or detected today Risk factors for fatal opioid overdose: None identified today Fatal overdose hazard ratio (HR): Calculation deferred Non-fatal overdose hazard ratio (HR): Calculation deferred Risk of opioid abuse or dependence: 0.7-3.0% with doses ? 36 MME/day and 6.1-26% with doses ? 120 MME/day. Substance use disorder (SUD) risk level: Pending results of Medical Psychology Evaluation for SUD Opioid risk tool (ORT) (Total Score): 2  ORT Scoring interpretation table:  Score <3 = Low Risk for SUD  Score between 4-7 = Moderate Risk for SUD  Score >8 = High Risk for Opioid Abuse   PHQ-2 Depression Scale:  Total score: 0  PHQ-2 Scoring interpretation table: (Score and probability of major depressive disorder)  Score 0 = No depression  Score 1 = 15.4% Probability  Score 2 = 21.1% Probability  Score 3 = 38.4% Probability  Score 4 = 45.5% Probability  Score 5 = 56.4% Probability  Score 6 = 78.6% Probability   PHQ-9 Depression Scale:  Total score: 0  PHQ-9 Scoring interpretation table:  Score 0-4 = No depression  Score 5-9 = Mild depression  Score 10-14 = Moderate depression  Score 15-19 = Moderately severe depression  Score 20-27 = Severe depression (2.4 times higher risk of SUD and 2.89 times higher risk of overuse)   Pharmacologic Plan: Pending ordered tests and/or consults  Meds  The patient has a current medication list which includes the following prescription(s): amlodipine, bupropion, ergocalciferol, ferrous sulfate, gabapentin, hydrochlorothiazide, metoprolol succinate, multivitamin, olmesartan-hydrochlorothiazide, pantoprazole, tramadol, and trazodone.  Current Outpatient Medications on File Prior to Visit  Medication Sig  . amLODipine (NORVASC) 5 MG tablet Take 5 mg by mouth daily.  .Marland KitchenbuPROPion (WELLBUTRIN XL) 300 MG 24 hr tablet Take 300 mg by mouth daily.  .  ergocalciferol (VITAMIN  D2) 50000 units capsule Take 50,000 Units by mouth once a week.  . ferrous sulfate 325 (65 FE) MG tablet Take 325 mg by mouth daily with breakfast.  . gabapentin (NEURONTIN) 300 MG capsule Take 300 mg by mouth 3 (three) times daily.  . hydrochlorothiazide (HYDRODIURIL) 12.5 MG tablet Take 12.5 mg by mouth daily.  . metoprolol succinate (TOPROL-XL) 50 MG 24 hr tablet Take 50 mg by mouth daily. Take with or immediately following a meal.  . Multiple Vitamin (MULTIVITAMIN) tablet Take 1 tablet by mouth daily.  Marland Kitchen olmesartan-hydrochlorothiazide (BENICAR HCT) 20-12.5 MG tablet Take 1 tablet by mouth daily.  . pantoprazole (PROTONIX) 40 MG tablet Take 40 mg by mouth daily.  . traMADol (ULTRAM) 50 MG tablet Take 50 mg by mouth every 12 (twelve) hours as needed.  . traZODone (DESYREL) 50 MG tablet Take 50 mg by mouth at bedtime.   No current facility-administered medications on file prior to visit.    Imaging Review  Lumbosacral Imaging: Lumbar MR wo contrast:  Results for orders placed during the hospital encounter of 12/13/15  MR Lumbar Spine Wo Contrast   Narrative CLINICAL DATA:  Pain extending down both legs to the knees with numbness and tingling. Worse after standing.  EXAM: MRI LUMBAR SPINE WITHOUT CONTRAST  TECHNIQUE: Multiplanar, multisequence MR imaging of the lumbar spine was performed. No intravenous contrast was administered.  COMPARISON:  None.  FINDINGS: Segmentation: Transitional anatomy. 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 Correlation with this numbering scheme would be important should intervention be contemplated.  Alignment: Mild curvature convex to the left with the apex at L2-3. 3 mm anterolisthesis at L2-3. 5 mm anterolisthesis at L4-5.  Vertebrae: No primary bone lesion. Discogenic marrow changes at the L2-3 level.  Conus medullaris: Extends to the L1-2 level and appears normal.  Paraspinal and other soft tissues: No  significant finding  Disc levels:  T12-L1 and L1-2: Normal.  L2-3: Disc degeneration with endplate osteophytes and shallow protrusion of the disc. Bilateral facet arthropathy with gaping, fluid-filled facet joints. Anterolisthesis of 3 mm. This would likely worsen with standing or flexion. There is moderate stenosis of the canal. There is foraminal stenosis right worse than left. Discogenic marrow changes would likely be associated with back pain.  L3-4: Bilateral facet arthropathy left worse than right. Disc degeneration with mild bulging of the disc. Prominent epidural fat. Constriction of the thecal sac but no apparent neural compression. Mild foraminal narrowing on the left.  L4-5: Bilateral facet arthropathy with 5 mm of anterolisthesis. Bulging of the disc. Moderate stenosis of the lateral recesses and neural foramina that could be symptomatic.  L5-S1: Transitional and normal.  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  L2-3: Advanced bilateral facet arthropathy with gaping, fluid-filled facet joints. Anterolisthesis of 3 mm that could worsen with standing or flexion. Disc degeneration with endplate osteophytes and shallow protrusion of disc material more tripped the right canal stenosis and right foraminal stenosis that could cause neural compression. This appearance could worsen with standing or flexion endplate marrow changes that could be associated with back pain.  L3-4: Facet arthropathy left worse than right that could be symptomatic. Bulging of the disc more towards the left. Prominent epidural fat. Constriction of the thecal sac.  L4-5: Advanced bilateral facet arthropathy with anterolisthesis of 5 mm. Bulging of the disc. Multifactorial stenosis. Narrowing of the lateral recesses and neural foramina could be symptomatic.   Electronically Signed   By: Jan Fireman.D.  On: 12/13/2015 13:44    Note: Available results from prior  imaging studies were reviewed.        ROS  Cardiovascular History: High blood pressure Pulmonary or Respiratory History: Snoring  and Temporary stoppage of breathing during sleep Neurological History: No reported neurological signs or symptoms such as seizures, abnormal skin sensations, urinary and/or fecal incontinence, being born with an abnormal open spine and/or a tethered spinal cord Review of Past Neurological Studies: No results found for this or any previous visit. Psychological-Psychiatric History: Anxiousness, Depressed and Difficulty sleeping and or falling asleep Gastrointestinal History: Reflux or heatburn Genitourinary History: No reported renal or genitourinary signs or symptoms such as difficulty voiding or producing urine, peeing blood, non-functioning kidney, kidney stones, difficulty emptying the bladder, difficulty controlling the flow of urine, or chronic kidney disease Hematological History: Weakness due to low blood hemoglobin or red blood cell count (Anemia) Endocrine History: High blood sugar requiring insulin (IDDM) Rheumatologic History: Joint aches and or swelling due to excess weight (Osteoarthritis) Musculoskeletal History: Negative for myasthenia gravis, muscular dystrophy, multiple sclerosis or malignant hyperthermia Work History: Disabled  Allergies  Ms. Offenberger is allergic to lisinopril.  Laboratory Chemistry  Inflammation Markers Lab Results  Component Value Date   CRP 28.4 (H) 09/13/2017   ESRSEDRATE 50 (H) 09/13/2017   (CRP: Acute Phase) (ESR: Chronic Phase) Renal Function Markers  Hepatic Function Markers  Electrolytes  Neuropathy Markers  Bone Pathology Markers  Coagulation Parameters Lab Results  Component Value Date   PLT 410 04/28/2017   Cardiovascular Markers Lab Results  Component Value Date   HGB 11.7 (L) 04/28/2017   HCT 35.9 04/28/2017   Note: Lab results reviewed.  [place]  Drug: Ms. Clouse  reports that she does not use  drugs. Alcohol:  reports that she does not drink alcohol. Tobacco:  reports that she has never smoked. She has never used smokeless tobacco. Medical:  has a past medical history of Depression, GERD (gastroesophageal reflux disease), and Hypertension. Family: family history includes Breast cancer in her maternal aunt.  Past Surgical History:  Procedure Laterality Date  . COLONOSCOPY WITH PROPOFOL N/A 01/09/2016   Procedure: COLONOSCOPY WITH PROPOFOL;  Surgeon: Lollie Sails, MD;  Location: [facility] ENDOSCOPY;  Service: Endoscopy;  Laterality: N/A;  . ESOPHAGOGASTRODUODENOSCOPY (EGD) WITH PROPOFOL N/A 01/09/2016   Procedure: ESOPHAGOGASTRODUODENOSCOPY (EGD) WITH PROPOFOL;  Surgeon: Lollie Sails, MD;  Location: [facility] ENDOSCOPY;  Service: Endoscopy;  Laterality: N/A;   Active Ambulatory Problems    Diagnosis Date Noted  . Acute maxillary sinusitis, unspecified 02/19/2014  . Allergic rhinitis 08/21/2014  . Anemia, unspecified 07/03/2015  . DDD (degenerative disc disease), lumbar 07/13/2017  . Depression 08/25/2017  . Elevated erythrocyte sedimentation rate 07/13/2017  . Encounter for other general examination 12/06/2011  . Encounter for screening mammogram for malignant neoplasm of breast 12/06/2011  . Essential (primary) hypertension 12/06/2011  . Generalized osteoarthritis of multiple sites 05/11/2017  . GERD (gastroesophageal reflux disease) 09/13/2017  . History of tachycardia 05/11/2017  . Low back pain 09/13/2017  . Other dorsalgia 09/16/2014  . Morbid obesity with BMI of 50.0-59.9, adult ([place]) 06/29/2017  . Obesity, unspecified 12/06/2011  . Numbness and tingling of foot 07/13/2017  . Obstructive sleep apnea 12/06/2011  . Other fecal abnormalities 08/15/2015  . Other general symptoms and signs 07/03/2015  . Other specified anxiety disorders 01/30/2013  . Pain in joint 12/06/2011  . Pain in right knee 08/07/2015  . Polyneuropathy 09/13/2017  . Prediabetes 07/03/2015  .  Tachycardia  09/13/2017  . Type 2 diabetes mellitus without complication, without long-term current use of insulin ([place]) 05/11/2017  . Vitamin D deficiency, unspecified 12/10/2011  . Morbid obesity with BMI of 40.0-44.9, adult ([place]) 05/11/2017  . Chronic bilateral low back pain with bilateral sciatica (Primary Area of Pain)(Midline)  (L>R) 09/13/2017  . Chronic pain of both hips (Secondary Area of Pain) (L>R) 09/13/2017  . Chronic pain of both lower extremities  (Tertiary Area of Pain)(L>R) 09/13/2017  . Chronic pain of both knees (Fourth Area of Pain)  (R>L) 09/13/2017  . Chronic pain syndrome 09/13/2017  . Long term current use of opiate analgesic 09/13/2017  . Pharmacologic therapy 09/13/2017  . Disorder of skeletal system 09/13/2017  . Problems influencing health status 09/13/2017  . Chronic sacroiliac joint pain 09/13/2017   Resolved Ambulatory Problems    Diagnosis Date Noted  . No Resolved Ambulatory Problems   Past Medical History:  Diagnosis Date  . Depression   . GERD (gastroesophageal reflux disease)   . Hypertension    Constitutional Exam  General appearance: alert, cooperative and morbidly obese Vitals:   09/13/17 1355  BP: 131/64  Pulse: 93  Resp: 16  Temp: 98.1 F (36.7 C)  TempSrc: Oral  SpO2: 100%  Weight: (!) 345 lb (156.5 kg)  Height: '5\' 4"'  (1.626 m)   BMI Assessment: Estimated body mass index is 59.22 kg/m as calculated from the following:   Height as of this encounter: '5\' 4"'  (1.626 m).   Weight as of this encounter: 345 lb (156.5 kg).  BMI interpretation table: BMI level Category Range association with higher incidence of chronic pain  <18 kg/m2 Underweight   18.5-24.9 kg/m2 Ideal body weight   25-29.9 kg/m2 Overweight Increased incidence by 20%  30-34.9 kg/m2 Obese (Class I) Increased incidence by 68%  35-39.9 kg/m2 Severe obesity (Class II) Increased incidence by 136%  >40 kg/m2 Extreme obesity (Class III) Increased incidence by 254%   BMI  Readings from Last 4 Encounters:  09/13/17 59.22 kg/m   Wt Readings from Last 4 Encounters:  09/13/17 (!) 345 lb (156.5 kg)  Psych/Mental status: Alert, oriented x 3 (person, place, & time)       Eyes: PERLA Respiratory: No evidence of acute respiratory distress  Cervical Spine Exam  Inspection: No masses, redness, or swelling Alignment: Symmetrical Functional ROM: Unrestricted ROM      Stability: No instability detected Muscle strength & Tone: Functionally intact Sensory: Unimpaired Palpation: No palpable anomalies              Upper Extremity (UE) Exam    Side: Right upper extremity  Side: Left upper extremity  Inspection: No masses, redness, swelling, or asymmetry. No contractures  Inspection: No masses, redness, swelling, or asymmetry. No contractures  Functional ROM: Unrestricted ROM          Functional ROM: Unrestricted ROM          Muscle strength & Tone: Functionally intact  Muscle strength & Tone: Functionally intact  Sensory: Unimpaired  Sensory: Unimpaired  Palpation: No palpable anomalies              Palpation: No palpable anomalies              Specialized Test(s): Deferred         Specialized Test(s): Deferred          Thoracic Spine Exam  Inspection: No masses, redness, or swelling Alignment: Symmetrical Functional ROM: Unrestricted ROM Stability: No instability detected Sensory: Unimpaired Muscle strength &  Tone: No palpable anomalies  Lumbar Spine Exam  Inspection: No masses, redness, or swelling Alignment: Symmetrical Functional ROM: Restricted ROM      Stability: No instability detected Muscle strength & Tone: Functionally intact Sensory: Unimpaired Palpation: Complains of area being tender to palpation       Provocative Tests: Lumbar Hyperextension and rotation test: Positive bilaterally for facet joint pain. Patrick's Maneuver: Positive for bilateral S-I arthralgia              Gait & Posture Assessment  Ambulation: Patient came in today in a  wheel chair Gait: Antalgic Posture: Antalgic   Lower Extremity Exam    Side: Right lower extremity  Side: Left lower extremity  Inspection: No masses, redness, swelling, or asymmetry. No contractures  Inspection: No masses, redness, swelling, or asymmetry. No contractures  Functional ROM: Decreased ROM          Functional ROM: Decreased ROM          Muscle strength & Tone: Deconditioned  Muscle strength & Tone: Deconditioned  Sensory: Unimpaired  Sensory: Unimpaired  Palpation: Complains of area being tender to palpation  Palpation: Complains of area being tender to palpation   Assessment  Primary Diagnosis & Pertinent Problem List: Diagnoses of Chronic bilateral low back pain with bilateral sciatica (Primary Area of Pain)(Midline)  (L>R), Chronic pain of both hips (Secondary Area of Pain) (L>R), Chronic pain of both lower extremities  (Tertiary Area of Pain)(L>R), Chronic pain of both knees (Fourth Area of Pain)  (R>L), Chronic pain syndrome, Long term current use of opiate analgesic, Pharmacologic therapy, Disorder of skeletal system, Problems influencing health status, and Chronic sacroiliac joint pain were pertinent to this visit.  Visit Diagnosis: 1. Chronic bilateral low back pain with bilateral sciatica (Primary Area of Pain)(Midline)  (L>R)   2. Chronic pain of both hips (Secondary Area of Pain) (L>R)   3. Chronic pain of both lower extremities  (Tertiary Area of Pain)(L>R)   4. Chronic pain of both knees (Fourth Area of Pain)  (R>L)   5. Chronic pain syndrome   6. Long term current use of opiate analgesic   7. Pharmacologic therapy   8. Disorder of skeletal system   9. Problems influencing health status   10. Chronic sacroiliac joint pain    Plan of Care  Initial treatment plan:  Please be advised that as per protocol, today's visit has been an evaluation only. We have not taken over the patient's controlled substance management.  Problem-specific plan: No problem-specific  Assessment & Plan notes found for this encounter.  Ordered Lab-work, Procedure(s), Referral(s), & Consult(s): Orders Placed This Encounter  Procedures  . DG HIP UNILAT W OR W/O PELVIS 2-3 VIEWS LEFT  . DG HIP UNILAT W OR W/O PELVIS 2-3 VIEWS RIGHT  . DG Si Joints  . DG Knee 1-2 Views Left  . DG Knee 1-2 Views Right  . Compliance Drug Analysis, Ur  . Comp. Metabolic Panel (12)  . Magnesium  . Vitamin B12  . Sedimentation rate  . 25-Hydroxyvitamin D Lcms D2+D3  . C-reactive protein  . Ambulatory referral to Psychology   Pharmacotherapy: Medications ordered:  No orders of the defined types were placed in this encounter.  Medications administered during this visit: Purvi Mahr had no medications administered during this visit.   Pharmacotherapy under consideration:  Opioid Analgesics: The patient was informed that there is no guarantee that she would be a candidate for opioid analgesics. The decision will be made following  CDC guidelines. This decision will be based on the results of diagnostic studies, as well as Ms. Tench's risk profile.  Membrane stabilizer: To be determined at a later time Muscle relaxant: To be determined at a later time NSAID: To be determined at a later time Other analgesic(s): To be determined at a later time   Interventional therapies under consideration: Ms. Zwick was informed that there is no guarantee that she would be a candidate for interventional therapies. The decision will be based on the results of diagnostic studies, as well as Ms. Longton's risk profile.  Possible procedure(s): Diagnostic bilateral LESI Diagnostic bilateral lumbar facet nerve block Possible bilateral lumbar facet RFA Diagnostic bilateral Hyalgan series Diagnostic bilateral intra-articular knee injections Diagnostic bilateral nerve blocks Possible bilateral genicular nerve RFA   Provider-requested follow-up: Return for 2nd Visit, w/ Dr. Dossie Arbour, after [facility] eval.  No  future appointments.  Primary Care Physician: Tracie Harrier, MD Location: [facility] Note by:  Date: 09/13/2017; Time: 11:20 AM  Pain Score Disclaimer: We use the NRS-11 scale. This is a self-reported, subjective measurement of pain severity with only modest accuracy. It is used primarily to identify changes within a particular patient. It must be understood that outpatient pain scales are significantly less accurate that those used for research, where they can be applied under ideal controlled circumstances with minimal exposure to variables. In reality, the score is likely to be a combination of pain intensity and pain affect, where pain affect describes the degree of emotional arousal or changes in action readiness caused by the sensory experience of pain. Factors such as social and work situation, setting, emotional state, anxiety levels, expectation, and prior pain experience may influence pain perception and show large inter-individual differences that may also be affected by time variables.  Patient instructions provided during this appointment: Patient Instructions   You will have Xrays done as ordered prior to next appt. with pain clinic.____________________________________________________________________________________________  Appointment Policy Summary  It is our goal and responsibility to provide the medical community with assistance in the evaluation and management of patients with chronic pain. Unfortunately our resources are limited. Because we do not have an unlimited amount of time, or available appointments, we are required to closely monitor and manage their use. The following rules exist to maximize their use:  Patient's responsibilities: 1. Punctuality:  At what time should I arrive? You should be physically present in our office 30 minutes before your scheduled appointment. Your scheduled appointment is with your assigned healthcare  provider. However, it takes 5-10 minutes to be "checked-in", and another 15 minutes for the nurses to do the admission. If you arrive to our office at the time you were given for your appointment, you will end up being at least 20-25 minutes late to your appointment with the provider. 2. Tardiness:  What happens if I arrive only a few minutes after my scheduled appointment time? You will need to reschedule your appointment. The cutoff is your appointment time. This is why it is so important that you arrive at least 30 minutes before that appointment. If you have an appointment scheduled for 10:00 AM and you arrive at 10:01, you will be required to reschedule your appointment.  3. Plan ahead:  Always assume that you will encounter traffic on your way in. Plan for it. If you are dependent on a driver, make sure they understand these rules and the need to arrive early. 4. Other appointments and responsibilities:  Avoid scheduling any other  appointments before or after your pain clinic appointments.  5. Be prepared:  Write down everything that you need to discuss with your healthcare provider and give this information to the admitting nurse. Write down the medications that you will need refilled. Bring your pills and bottles (even the empty ones), to all of your appointments, except for those where a procedure is scheduled. 6. No children or pets:  Find someone to take care of them. It is not appropriate to bring them in. 7. Scheduling changes:  We request "advanced notification" of any changes or cancellations. 8. Advanced notification:  Defined as a time period of more than 24 hours prior to the originally scheduled appointment. This allows for the appointment to be offered to other patients. 9. Rescheduling:  When a visit is rescheduled, it will require the cancellation of the original appointment. For this reason they both fall within the category of "Cancellations".  10. Cancellations:  They  require advanced notification. Any cancellation less than 24 hours before the  appointment will be recorded as a "No Show". 11. No Show:  Defined as an unkept appointment where the patient failed to notify or declare to the practice their intention or inability to keep the appointment.  Corrective process for repeat offenders:  1. Tardiness: Three (3) episodes of rescheduling due to late arrivals will be recorded as one (1) "No Show". 2. Cancellation or reschedule: Three (3) cancellations or rescheduling will be recorded as one (1) "No Show". 3. "No Shows": Three (3) "No Shows" within a 12 month period will result in discharge from the practice. ____________________________________________________________________________________________  ____________________________________________________________________________________________  Pain Scale  Introduction: The pain score used by this practice is the Verbal Numerical Rating Scale (VNRS-11). This is an 11-point scale. It is for adults and children 10 years or older. There are significant differences in how the pain score is reported, used, and applied. Forget everything you learned in the past and learn this scoring system.  General Information: The scale should reflect your current level of pain. Unless you are specifically asked for the level of your worst pain, or your average pain. If you are asked for one of these two, then it should be understood that it is over the past 24 hours.  Basic Activities of Daily Living (ADL): Personal hygiene, dressing, eating, transferring, and using restroom.  Instructions: Most patients tend to report their level of pain as a combination of two factors, their physical pain and their psychosocial pain. This last one is also known as "suffering" and it is reflection of how physical pain affects you socially and psychologically. From now on, report them separately. From this point on, when asked to report your pain  level, report only your physical pain. Use the following table for reference.  Pain Clinic Pain Levels (0-5/10)  Pain Level Score  Description  No Pain 0   Mild pain 1 Nagging, annoying, but does not interfere with basic activities of daily living (ADL). Patients are able to eat, bathe, get dressed, toileting (being able to get on and off the toilet and perform personal hygiene functions), transfer (move in and out of bed or a chair without assistance), and maintain continence (able to control bladder and bowel functions). Blood pressure and heart rate are unaffected. A normal heart rate for a healthy adult ranges from 60 to 100 bpm (beats per minute).   Mild to moderate pain 2 Noticeable and distracting. Impossible to hide from other people. More frequent flare-ups. Still possible to  adapt and function close to normal. It can be very annoying and may have occasional stronger flare-ups. With discipline, patients may get used to it and adapt.   Moderate pain 3 Interferes significantly with activities of daily living (ADL). It becomes difficult to feed, bathe, get dressed, get on and off the toilet or to perform personal hygiene functions. Difficult to get in and out of bed or a chair without assistance. Very distracting. With effort, it can be ignored when deeply involved in activities.   Moderately severe pain 4 Impossible to ignore for more than a few minutes. With effort, patients may still be able to manage work or participate in some social activities. Very difficult to concentrate. Signs of autonomic nervous system discharge are evident: dilated pupils (mydriasis); mild sweating (diaphoresis); sleep interference. Heart rate becomes elevated (>115 bpm). Diastolic blood pressure (lower number) rises above 100 mmHg. Patients find relief in laying down and not moving.   Severe pain 5 Intense and extremely unpleasant. Associated with frowning face and frequent crying. Pain overwhelms the senses.   Ability to do any activity or maintain social relationships becomes significantly limited. Conversation becomes difficult. Pacing back and forth is common, as getting into a comfortable position is nearly impossible. Pain wakes you up from deep sleep. Physical signs will be obvious: pupillary dilation; increased sweating; goosebumps; brisk reflexes; cold, clammy hands and feet; nausea, vomiting or dry heaves; loss of appetite; significant sleep disturbance with inability to fall asleep or to remain asleep. When persistent, significant weight loss is observed due to the complete loss of appetite and sleep deprivation.  Blood pressure and heart rate becomes significantly elevated. Caution: If elevated blood pressure triggers a pounding headache associated with blurred vision, then the patient should immediately seek attention at an urgent or emergency care unit, as these may be signs of an impending stroke.    Emergency Department Pain Levels (6-10/10)  Emergency Room Pain 6 Severely limiting. Requires emergency care and should not be seen or managed at an outpatient pain management facility. Communication becomes difficult and requires great effort. Assistance to reach the emergency department may be required. Facial flushing and profuse sweating along with potentially dangerous increases in heart rate and blood pressure will be evident.   Distressing pain 7 Self-care is very difficult. Assistance is required to transport, or use restroom. Assistance to reach the emergency department will be required. Tasks requiring coordination, such as bathing and getting dressed become very difficult.   Disabling pain 8 Self-care is no longer possible. At this level, pain is disabling. The individual is unable to do even the most "basic" activities such as walking, eating, bathing, dressing, transferring to a bed, or toileting. Fine motor skills are lost. It is difficult to think clearly.   Incapacitating pain 9 Pain  becomes incapacitating. Thought processing is no longer possible. Difficult to remember your own name. Control of movement and coordination are lost.   The worst pain imaginable 10 At this level, most patients pass out from pain. When this level is reached, collapse of the autonomic nervous system occurs, leading to a sudden drop in blood pressure and heart rate. This in turn results in a temporary and dramatic drop in blood flow to the brain, leading to a loss of consciousness. Fainting is one of the body's self defense mechanisms. Passing out puts the brain in a calmed state and causes it to shut down for a while, in order to begin the healing process.    Summary:  1. Refer to this scale when providing [place] with your pain level. 2. Be accurate and careful when reporting your pain level. This will help with your care. 3. Over-reporting your pain level will lead to loss of credibility. 4. Even a level of 1/10 means that there is pain and will be treated at our facility. 5. High, inaccurate reporting will be documented as "Symptom Exaggeration", leading to loss of credibility and suspicions of possible secondary gains such as obtaining more narcotics, or wanting to appear disabled, for fraudulent reasons. 6. Only pain levels of 5 or below will be seen at our facility. 7. Pain levels of 6 and above will be sent to the Emergency Department and the appointment cancelled. ____________________________________________________________________________________________    BMI Assessment: Estimated body mass index is 59.22 kg/m as calculated from the following:   Height as of this encounter: '5\' 4"'  (1.626 m).   Weight as of this encounter: 345 lb (156.5 kg).  BMI interpretation table: BMI level Category Range association with higher incidence of chronic pain  <18 kg/m2 Underweight   18.5-24.9 kg/m2 Ideal body weight   25-29.9 kg/m2 Overweight Increased incidence by 20%  30-34.9 kg/m2 Obese (Class I)  Increased incidence by 68%  35-39.9 kg/m2 Severe obesity (Class II) Increased incidence by 136%  >40 kg/m2 Extreme obesity (Class III) Increased incidence by 254%   BMI Readings from Last 4 Encounters:  09/13/17 59.22 kg/m   Wt Readings from Last 4 Encounters:  09/13/17 (!) 345 lb (156.5 kg)

## 2017-09-13 NOTE — Progress Notes (Signed)
Safety precautions to be maintained throughout the outpatient stay will include: orient to surroundings, keep bed in low position, maintain call bell within reach at all times, provide assistance with transfer out of bed and ambulation.  

## 2017-09-13 NOTE — Patient Instructions (Addendum)
You will have Xrays done as ordered prior to next appt. with pain clinic.____________________________________________________________________________________________  Appointment Policy Summary  It is our goal and responsibility to provide the medical community with assistance in the evaluation and management of patients with chronic pain. Unfortunately our resources are limited. Because we do not have an unlimited amount of time, or available appointments, we are required to closely monitor and manage their use. The following rules exist to maximize their use:  Patient's responsibilities: 1. Punctuality:  At what time should I arrive? You should be physically present in our office 30 minutes before your scheduled appointment. Your scheduled appointment is with your assigned healthcare provider. However, it takes 5-10 minutes to be "checked-in", and another 15 minutes for the nurses to do the admission. If you arrive to our office at the time you were given for your appointment, you will end up being at least 20-25 minutes late to your appointment with the provider. 2. Tardiness:  What happens if I arrive only a few minutes after my scheduled appointment time? You will need to reschedule your appointment. The cutoff is your appointment time. This is why it is so important that you arrive at least 30 minutes before that appointment. If you have an appointment scheduled for 10:00 AM and you arrive at 10:01, you will be required to reschedule your appointment.  3. Plan ahead:  Always assume that you will encounter traffic on your way in. Plan for it. If you are dependent on a driver, make sure they understand these rules and the need to arrive early. 4. Other appointments and responsibilities:  Avoid scheduling any other appointments before or after your pain clinic appointments.  5. Be prepared:  Write down everything that you need to discuss with your healthcare provider and give this information to  the admitting nurse. Write down the medications that you will need refilled. Bring your pills and bottles (even the empty ones), to all of your appointments, except for those where a procedure is scheduled. 6. No children or pets:  Find someone to take care of them. It is not appropriate to bring them in. 7. Scheduling changes:  We request "advanced notification" of any changes or cancellations. 8. Advanced notification:  Defined as a time period of more than 24 hours prior to the originally scheduled appointment. This allows for the appointment to be offered to other patients. 9. Rescheduling:  When a visit is rescheduled, it will require the cancellation of the original appointment. For this reason they both fall within the category of "Cancellations".  10. Cancellations:  They require advanced notification. Any cancellation less than 24 hours before the  appointment will be recorded as a "No Show". 11. No Show:  Defined as an unkept appointment where the patient failed to notify or declare to the practice their intention or inability to keep the appointment.  Corrective process for repeat offenders:  1. Tardiness: Three (3) episodes of rescheduling due to late arrivals will be recorded as one (1) "No Show". 2. Cancellation or reschedule: Three (3) cancellations or rescheduling will be recorded as one (1) "No Show". 3. "No Shows": Three (3) "No Shows" within a 12 month period will result in discharge from the practice. ____________________________________________________________________________________________  ____________________________________________________________________________________________  Pain Scale  Introduction: The pain score used by this practice is the Verbal Numerical Rating Scale (VNRS-11). This is an 11-point scale. It is for adults and children 10 years or older. There are significant differences in how the pain score is  reported, used, and applied. Forget everything  you learned in the past and learn this scoring system.  General Information: The scale should reflect your current level of pain. Unless you are specifically asked for the level of your worst pain, or your average pain. If you are asked for one of these two, then it should be understood that it is over the past 24 hours.  Basic Activities of Daily Living (ADL): Personal hygiene, dressing, eating, transferring, and using restroom.  Instructions: Most patients tend to report their level of pain as a combination of two factors, their physical pain and their psychosocial pain. This last one is also known as "suffering" and it is reflection of how physical pain affects you socially and psychologically. From now on, report them separately. From this point on, when asked to report your pain level, report only your physical pain. Use the following table for reference.  Pain Clinic Pain Levels (0-5/10)  Pain Level Score  Description  No Pain 0   Mild pain 1 Nagging, annoying, but does not interfere with basic activities of daily living (ADL). Patients are able to eat, bathe, get dressed, toileting (being able to get on and off the toilet and perform personal hygiene functions), transfer (move in and out of bed or a chair without assistance), and maintain continence (able to control bladder and bowel functions). Blood pressure and heart rate are unaffected. A normal heart rate for a healthy adult ranges from 60 to 100 bpm (beats per minute).   Mild to moderate pain 2 Noticeable and distracting. Impossible to hide from other people. More frequent flare-ups. Still possible to adapt and function close to normal. It can be very annoying and may have occasional stronger flare-ups. With discipline, patients may get used to it and adapt.   Moderate pain 3 Interferes significantly with activities of daily living (ADL). It becomes difficult to feed, bathe, get dressed, get on and off the toilet or to perform personal  hygiene functions. Difficult to get in and out of bed or a chair without assistance. Very distracting. With effort, it can be ignored when deeply involved in activities.   Moderately severe pain 4 Impossible to ignore for more than a few minutes. With effort, patients may still be able to manage work or participate in some social activities. Very difficult to concentrate. Signs of autonomic nervous system discharge are evident: dilated pupils (mydriasis); mild sweating (diaphoresis); sleep interference. Heart rate becomes elevated (>115 bpm). Diastolic blood pressure (lower number) rises above 100 mmHg. Patients find relief in laying down and not moving.   Severe pain 5 Intense and extremely unpleasant. Associated with frowning face and frequent crying. Pain overwhelms the senses.  Ability to do any activity or maintain social relationships becomes significantly limited. Conversation becomes difficult. Pacing back and forth is common, as getting into a comfortable position is nearly impossible. Pain wakes you up from deep sleep. Physical signs will be obvious: pupillary dilation; increased sweating; goosebumps; brisk reflexes; cold, clammy hands and feet; nausea, vomiting or dry heaves; loss of appetite; significant sleep disturbance with inability to fall asleep or to remain asleep. When persistent, significant weight loss is observed due to the complete loss of appetite and sleep deprivation.  Blood pressure and heart rate becomes significantly elevated. Caution: If elevated blood pressure triggers a pounding headache associated with blurred vision, then the patient should immediately seek attention at an urgent or emergency care unit, as these may be signs of an impending stroke.  Emergency Department Pain Levels (6-10/10)  Emergency Room Pain 6 Severely limiting. Requires emergency care and should not be seen or managed at an outpatient pain management facility. Communication becomes difficult and  requires great effort. Assistance to reach the emergency department may be required. Facial flushing and profuse sweating along with potentially dangerous increases in heart rate and blood pressure will be evident.   Distressing pain 7 Self-care is very difficult. Assistance is required to transport, or use restroom. Assistance to reach the emergency department will be required. Tasks requiring coordination, such as bathing and getting dressed become very difficult.   Disabling pain 8 Self-care is no longer possible. At this level, pain is disabling. The individual is unable to do even the most "basic" activities such as walking, eating, bathing, dressing, transferring to a bed, or toileting. Fine motor skills are lost. It is difficult to think clearly.   Incapacitating pain 9 Pain becomes incapacitating. Thought processing is no longer possible. Difficult to remember your own name. Control of movement and coordination are lost.   The worst pain imaginable 10 At this level, most patients pass out from pain. When this level is reached, collapse of the autonomic nervous system occurs, leading to a sudden drop in blood pressure and heart rate. This in turn results in a temporary and dramatic drop in blood flow to the brain, leading to a loss of consciousness. Fainting is one of the body's self defense mechanisms. Passing out puts the brain in a calmed state and causes it to shut down for a while, in order to begin the healing process.    Summary: 1. Refer to this scale when providing [place] with your pain level. 2. Be accurate and careful when reporting your pain level. This will help with your care. 3. Over-reporting your pain level will lead to loss of credibility. 4. Even a level of 1/10 means that there is pain and will be treated at our facility. 5. High, inaccurate reporting will be documented as "Symptom Exaggeration", leading to loss of credibility and suspicions of possible secondary gains such as  obtaining more narcotics, or wanting to appear disabled, for fraudulent reasons. 6. Only pain levels of 5 or below will be seen at our facility. 7. Pain levels of 6 and above will be sent to the Emergency Department and the appointment cancelled. ____________________________________________________________________________________________    BMI Assessment: Estimated body mass index is 59.22 kg/m as calculated from the following:   Height as of this encounter: 5\' 4"  (1.626 m).   Weight as of this encounter: 345 lb (156.5 kg).  BMI interpretation table: BMI level Category Range association with higher incidence of chronic pain  <18 kg/m2 Underweight   18.5-24.9 kg/m2 Ideal body weight   25-29.9 kg/m2 Overweight Increased incidence by 20%  30-34.9 kg/m2 Obese (Class I) Increased incidence by 68%  35-39.9 kg/m2 Severe obesity (Class II) Increased incidence by 136%  >40 kg/m2 Extreme obesity (Class III) Increased incidence by 254%   BMI Readings from Last 4 Encounters:  09/13/17 59.22 kg/m   Wt Readings from Last 4 Encounters:  09/13/17 (!) 345 lb (156.5 kg)

## 2017-09-17 LAB — 25-HYDROXYVITAMIN D LCMS D2+D3: 25-HYDROXY, VITAMIN D: 52 ng/mL

## 2017-09-17 LAB — COMP. METABOLIC PANEL (12)
A/G RATIO: 1.4 (ref 1.2–2.2)
ALK PHOS: 98 IU/L (ref 39–117)
AST: 14 IU/L (ref 0–40)
Albumin: 4.2 g/dL (ref 3.5–5.5)
BILIRUBIN TOTAL: 0.3 mg/dL (ref 0.0–1.2)
BUN/Creatinine Ratio: 9 (ref 9–23)
BUN: 8 mg/dL (ref 6–24)
CHLORIDE: 101 mmol/L (ref 96–106)
Calcium: 10.5 mg/dL — ABNORMAL HIGH (ref 8.7–10.2)
Creatinine, Ser: 0.9 mg/dL (ref 0.57–1.00)
GFR calc Af Amer: 83 mL/min/{1.73_m2} (ref >59)
GFR calc non Af Amer: 72 mL/min/{1.73_m2} (ref >59)
GLOBULIN, TOTAL: 3 g/dL (ref 1.5–4.5)
Glucose: 82 mg/dL (ref 65–99)
POTASSIUM: 4.2 mmol/L (ref 3.5–5.2)
Sodium: 143 mmol/L (ref 134–144)
Total Protein: 7.2 g/dL (ref 6.0–8.5)

## 2017-09-17 LAB — SEDIMENTATION RATE: SED RATE: 50 mm/h — AB (ref 0–40)

## 2017-09-17 LAB — 25-HYDROXY VITAMIN D LCMS D2+D3
25-Hydroxy, Vitamin D-2: 1.9 ng/mL
25-Hydroxy, Vitamin D-3: 50 ng/mL

## 2017-09-17 LAB — VITAMIN B12: Vitamin B-12: >2000 pg/mL — ABNORMAL HIGH (ref 232–1245)

## 2017-09-17 LAB — MAGNESIUM: MAGNESIUM: 1.9 mg/dL (ref 1.6–2.3)

## 2017-09-17 LAB — C-REACTIVE PROTEIN: CRP: 28.4 mg/L — AB (ref 0.0–4.9)

## 2017-09-18 LAB — COMPLIANCE DRUG ANALYSIS, UR

## 2017-09-19 NOTE — Progress Notes (Signed)
Results were reviewed and found to be: mildly abnormal  No acute injury or pathology identified  Review would suggest interventional pain management techniques may be of benefit 

## 2017-09-19 NOTE — Progress Notes (Signed)
Results were reviewed and found to be: abnormal  No acute injury or pathology identified  Review would suggest interventional pain management techniques may be of benefit

## 2017-11-01 NOTE — Progress Notes (Signed)
Patient's Name: Sabrina Castro  MRN: [id number]  Referring Provider: Tracie Harrier, MD  DOB: 1961/01/11  PCP: Tracie Harrier, MD  DOS: 11/02/2017  Note by: Gaspar Cola, MD  Service setting: Ambulatory outpatient  Specialty: Interventional Pain Management  Location: [facility] (AMB) Pain Management Facility    Patient type: Established   Primary Reason(s) for Visit: Encounter for evaluation before starting new chronic pain management plan of care (Level of risk: moderate) CC: Hip Pain  HPI  Sabrina Castro is a 57 y.o. year old, female patient, who comes today for a follow-up evaluation to review the test results and decide on a treatment plan. She has Acute maxillary sinusitis, unspecified; Allergic rhinitis; Anemia, unspecified; DDD (degenerative disc disease), lumbar; Depression; Elevated erythrocyte sedimentation rate; Encounter for other general examination; Encounter for screening mammogram for malignant neoplasm of breast; Essential (primary) hypertension; Generalized osteoarthritis of multiple sites; GERD (gastroesophageal reflux disease); History of tachycardia; Morbid obesity with BMI of 50.0-59.9, adult ([place]); Obesity, unspecified; Numbness and tingling of foot; Obstructive sleep apnea; Other fecal abnormalities; Other general symptoms and signs; Other specified anxiety disorders; Pain in right knee; Polyneuropathy; Prediabetes; Tachycardia; Type 2 diabetes mellitus without complication, without long-term current use of insulin ([place]); Vitamin D deficiency, unspecified; Morbid obesity with BMI of 40.0-44.9, adult ([place]); Chronic low back pain (Primary Area of Pain) (Bilateral) (Midline) (L>R) w/ sciatica (Bilateral); Chronic hip pain (Secondary Area of Pain) (Bilateral) (L>R); Chronic lower extremity pain (Tertiary Area of Pain) (Bilateral) (L>R); Chronic knee pain (Fourth Area of Pain) (Bilateral) (R>L); Chronic pain syndrome; Long term current use of opiate analgesic; Pharmacologic therapy;  Disorder of skeletal system; Problems influencing health status; Chronic sacroiliac joint pain; Anterolisthesis (L2-3 and L4-5); Lumbar facet arthropathy (L2-3, L3-4, L4-5) (Bilateral); Lumbar foraminal stenosis (Right: L2-3) (Bilateral: L4-5); Lumbar lateral recess stenosis (Right: L2-3) (Bilateral: L4-5); Abnormal MRI, lumbar spine; Osteoarthritis of hips (Bilateral); Osteoarthritis of knees (Bilateral); Spondylosis without myelopathy or radiculopathy, lumbosacral region; Other specified dorsopathies, sacral and sacrococcygeal region; Elevated C-reactive protein (CRP); and Neurogenic pain on their problem list. Her primarily concern today is the Hip Pain  Pain Assessment: Location: Left, Right, Lower Hip(Hip, back and both knee) Radiating: Pain radiates down back to both hip and both knee and toe are numb and tingling Onset: More than a month ago Duration: Chronic pain Quality: Constant, Aching, Numbness, Discomfort Severity: 9 /10 (subjective, self-reported pain score)  Note: Reported level is inconsistent with clinical observations. Clinically the patient looks like a 3/10 A 3/10 is viewed as "Moderate" and described as significantly interfering with activities of daily living (ADL). It becomes difficult to feed, bathe, get dressed, get on and off the toilet or to perform personal hygiene functions. Difficult to get in and out of bed or a chair without assistance. Very distracting. With effort, it can be ignored when deeply involved in activities. Information on the proper use of the pain scale provided to the patient today. When using our objective Pain Scale, levels between 6 and 10/10 are said to belong in an emergency room, as it progressively worsens from a 6/10, described as severely limiting, requiring emergency care not usually available at an outpatient pain management facility. At a 6/10 level, communication becomes difficult and requires great effort. Assistance to reach the emergency  department may be required. Facial flushing and profuse sweating along with potentially dangerous increases in heart rate and blood pressure will be evident. Effect on ADL: pain limits all activites Timing: Constant Modifying factors: Denies BP: (!) 143/76  HR: 66  Sabrina Castro comes in today for a follow-up visit after her initial evaluation on 09/13/2017. Today we went over the results of her tests. These were explained in "Layman's terms". During today's appointment we went over my diagnostic impression, as well as the proposed treatment plan.  According to the patient her primary area of pain is in her lower back (B) (R>L). She admits this is been progressing over the last year. She describes the pain is in midline. She denies any previous surgeries however has been seen by neurosurgeon Dr. Lacinda Axon no surgery at this time. She admits that she was seen by Dr. Sharlet Salina for transforaminal epidural steroid injections (Bilateral L5-S1 TFESI, followed by left L5-S1 TFESI + Right S1 TFESI) (benefit lasted only 1 day)which were somewhat effective. She denies any physical therapy. She admits that she has been seen by chiropractor in the past however the pain is getting worse.  Her second area of pain is in her lower extremities (B) (R>L). BLE numbness below knees (back of heels feels like a burning sensation). She admits that the right side is greater than the left. (Knee pain (B) (R>L) > Hip pain (B) (R>L)) She describes the pain as being in both of her hips. She admits that the pain goes down the back of her leg to the bottom of her heel. She describes as a burning type pain. She also admits that the pain does go down the outer portion of her legs. She has had an (LE) EMG, however, because of the patient's size, they were unable to reach muscles and therefore it was not valid. EMG/PNCV at [facility] neurology.  Her third area of pain is in her knees (B) (R>L). She admits that the right is greater than the left. She  denies any previous surgery. She has had Synvisc injections x3 (Dr. Morley Kos) in the past. Did not help even after the third. She has had physical therapy but states that it was not effective.  In considering the treatment plan options, Ms. Lazenby was reminded that I no longer take patients for medication management only. I asked her to let me know if she had no intention of taking advantage of the interventional therapies, so that we could make arrangements to provide this space to someone interested. I also made it clear that undergoing interventional therapies for the purpose of getting pain medications is very inappropriate on the part of a patient, and it will not be tolerated in this practice. This type of behavior would suggest true addiction and therefore it requires referral to an addiction specialist.   Further details on both, my assessment(s), as well as the proposed treatment plan, please see below.  Controlled Substance Pharmacotherapy Assessment REMS (Risk Evaluation and Mitigation Strategy)  Analgesic: Tramadol 50 mg twice daily (last fill date 08/15/2017) tramadol 100 mg per day Highest recorded MME/day: 20 mg/day MME/day: 38m/day Pill Count: None expected due to no prior prescriptions written by our practice. No notes on file Pharmacokinetics: Liberation and absorption (onset of action): WNL Distribution (time to peak effect): WNL Metabolism and excretion (duration of action): WNL         Pharmacodynamics: Desired effects: Analgesia: Ms. MBocanegrareports >50% benefit. Functional ability: Patient reports that medication allows her to accomplish basic ADLs Clinically meaningful improvement in function (CMIF): Sustained CMIF goals met Perceived effectiveness: Described as relatively effective, allowing for increase in activities of daily living (ADL) Undesirable effects: Side-effects or Adverse reactions: None reported Monitoring: [place] PMP:  Online review of the past 69-month period previously conducted. Not applicable at this point since we have not taken over the patient's medication management yet. List of other Serum/Urine Drug Screening Test(s):  No results found. List of all UDS test(s) done:  Lab Results  Component Value Date   SUMMARY FINAL 09/13/2017   Last UDS on record: Summary  Date Value Ref Range Status  09/13/2017 FINAL  Final    Comment:    ==================================================================== TOXASSURE COMP DRUG ANALYSIS,UR ==================================================================== Test                             Result       Flag       Units Drug Present and Declared for Prescription Verification   Tramadol                       >2119        EXPECTED   ng/mg creat   O-Desmethyltramadol            786          EXPECTED   ng/mg creat   N-Desmethyltramadol            >2119        EXPECTED   ng/mg creat    Source of tramadol is a prescription medication.    O-desmethyltramadol and N-desmethyltramadol are expected    metabolites of tramadol.   Gabapentin                     PRESENT      EXPECTED   Bupropion                      PRESENT      EXPECTED   Hydroxybupropion               PRESENT      EXPECTED    Hydroxybupropion is an expected metabolite of bupropion. Drug Absent but Declared for Prescription Verification   Trazodone                      Not Detected UNEXPECTED   Metoprolol                     Not Detected UNEXPECTED ==================================================================== Test                      Result    Flag   Units      Ref Range   Creatinine              236              mg/dL      >=20 ==================================================================== Declared Medications:  The flagging and interpretation on this report are based on the  following declared medications.  Unexpected results may arise from  inaccuracies in the declared medications.  **Note: The testing scope of  this panel includes these medications:  Bupropion  Gabapentin  Metoprolol  Tramadol  Trazodone  **Note: The testing scope of this panel does not include following  reported medications:  Amlodipine Besylate  Hydrochlorothiazide  Iron (Ferrous Sulfate)  Multivitamin  Olmesartan  Pantoprazole  Vitamin D2 (Ergocalciferol) ==================================================================== For clinical consultation, please call [phone]====    UDS interpretation: No unexpected findings.          Medication Assessment  Form: Patient introduced to form today Treatment compliance: Treatment may start today if patient agrees with proposed plan. Evaluation of compliance is not applicable at this point Risk Assessment Profile: Aberrant behavior: See initial evaluations. None observed or detected today Comorbid factors increasing risk of overdose: See initial evaluation. No additional risks detected today Medical Psychology Evaluation: Please see scanned results in medical record. Opioid Risk Tool - 09/13/17 1413      Family History of Substance Abuse   Alcohol  Positive Female    Illegal Drugs  Negative    Rx Drugs  Negative      Personal History of Substance Abuse   Alcohol  Negative    Illegal Drugs  Negative    Rx Drugs  Negative      Age   Age between 61-45 years   No      History of Preadolescent Sexual Abuse   History of Preadolescent Sexual Abuse  Negative or Female      Psychological Disease   Psychological Disease  Negative    Depression  Positive      Total Score   Opioid Risk Tool Scoring  2    Opioid Risk Interpretation  Low Risk      ORT Scoring interpretation table:  Score <3 = Low Risk for SUD  Score between 4-7 = Moderate Risk for SUD  Score >8 = High Risk for Opioid Abuse   Risk Mitigation Strategies:  Patient opioid safety counseling: Completed today. Counseling provided to  patient as per "Patient Counseling Document". Document signed by patient, attesting to counseling and understanding Patient-Prescriber Agreement (PPA): Obtained today.  Controlled substance notification to other providers: Written and sent today.  Pharmacologic Plan: Today we may be taking over the patient's pharmacological regimen. See below.             Laboratory Chemistry  Inflammation Markers (CRP: Acute Phase) (ESR: Chronic Phase) Lab Results  Component Value Date   CRP 28.4 (H) 09/13/2017   ESRSEDRATE 50 (H) 09/13/2017                         Renal Function Markers Lab Results  Component Value Date   BUN 8 09/13/2017   CREATININE 0.90 09/13/2017   BCR 9 09/13/2017   GFRAA 83 09/13/2017   GFRNONAA 72 09/13/2017                             Hepatic Function Markers Lab Results  Component Value Date   AST 14 09/13/2017   ALBUMIN 4.2 09/13/2017   ALKPHOS 98 09/13/2017                        Electrolytes Lab Results  Component Value Date   NA 143 09/13/2017   K 4.2 09/13/2017   CL 101 09/13/2017   CALCIUM 10.5 (H) 09/13/2017   MG 1.9 09/13/2017                        Neuropathy Markers Lab Results  Component Value Date   VITAMINB12 >2000 (H) 09/13/2017                        Bone Pathology Markers Lab Results  Component Value Date   25OHVITD1 52 09/13/2017   25OHVITD2 1.9 09/13/2017   25OHVITD3 50 09/13/2017  Coagulation Parameters Lab Results  Component Value Date   PLT 410 04/28/2017                        Cardiovascular Markers Lab Results  Component Value Date   TROPONINI <0.03 04/28/2017   HGB 11.7 (L) 04/28/2017   HCT 35.9 04/28/2017                         Note: Lab results reviewed.  Recent Diagnostic Imaging Review  Lumbosacral Imaging: Lumbar MR wo contrast:  Results for orders placed during the hospital encounter of 12/13/15  MR Lumbar Spine Wo Contrast   Narrative CLINICAL DATA:  Pain extending down both  legs to the knees with numbness and tingling. Worse after standing.  EXAM: MRI LUMBAR SPINE WITHOUT CONTRAST  TECHNIQUE: Multiplanar, multisequence MR imaging of the lumbar spine was performed. No intravenous contrast was administered.  COMPARISON:  None.  FINDINGS: Segmentation: Transitional anatomy. 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 Correlation with this numbering scheme would be important should intervention be contemplated.  Alignment: Mild curvature convex to the left with the apex at L2-3. 3 mm anterolisthesis at L2-3. 5 mm anterolisthesis at L4-5.  Vertebrae: No primary bone lesion. Discogenic marrow changes at the L2-3 level.  Conus medullaris: Extends to the L1-2 level and appears normal.  Paraspinal and other soft tissues: No significant finding  Disc levels:  T12-L1 and L1-2: Normal.  L2-3: Disc degeneration with endplate osteophytes and shallow protrusion of the disc. Bilateral facet arthropathy with gaping, fluid-filled facet joints. Anterolisthesis of 3 mm. This would likely worsen with standing or flexion. There is moderate stenosis of the canal. There is foraminal stenosis right worse than left. Discogenic marrow changes would likely be associated with back pain.  L3-4: Bilateral facet arthropathy left worse than right. Disc degeneration with mild bulging of the disc. Prominent epidural fat. Constriction of the thecal sac but no apparent neural compression. Mild foraminal narrowing on the left.  L4-5: Bilateral facet arthropathy with 5 mm of anterolisthesis. Bulging of the disc. Moderate stenosis of the lateral recesses and neural foramina that could be symptomatic.  L5-S1: Transitional and normal.  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  L2-3: Advanced bilateral facet arthropathy with gaping, fluid-filled facet joints. Anterolisthesis of 3 mm that could worsen with standing or flexion. Disc degeneration with  endplate osteophytes and shallow protrusion of disc material more tripped the right canal stenosis and right foraminal stenosis that could cause neural compression. This appearance could worsen with standing or flexion endplate marrow changes that could be associated with back pain.  L3-4: Facet arthropathy left worse than right that could be symptomatic. Bulging of the disc more towards the left. Prominent epidural fat. Constriction of the thecal sac.  L4-5: Advanced bilateral facet arthropathy with anterolisthesis of 5 mm. Bulging of the disc. Multifactorial stenosis. Narrowing of the lateral recesses and neural foramina could be symptomatic.   Electronically Signed   By: Nelson Chimes M.D.   On: 12/13/2015 13:44    Sacroiliac Joint Imaging: Sacroiliac Joint DG:  Results for orders placed during the hospital encounter of 09/13/17  DG Si Joints   Narrative CLINICAL DATA:  Chronic sacroiliac joint pain  EXAM: BILATERAL SACROILIAC JOINTS - 3+ VIEW  COMPARISON:  None.  FINDINGS: There is mild degenerative type spurring at the right sacroiliac joint. Bilateral hip spurring with dedicated hip views obtained contemporaneously. The L5  right transverse process is incompletely segmented from the right sacral ala, without superimposed spurring.  IMPRESSION: 1. Minor sacroiliac spurring on the right.  No erosive changes. 2. Bilateral degenerative hip spurring. 3. Incomplete segmentation of the L5 right transverse process from the sacrum. No superimposed spurring to suggest Bertolotti's syndrome.   Electronically Signed   By: Monte Fantasia M.D.   On: 09/14/2017 09:32    Hip Imaging: Hip-R DG 2-3 views:  Results for orders placed during the hospital encounter of 09/13/17  DG HIP UNILAT W OR W/O PELVIS 2-3 VIEWS RIGHT   Narrative CLINICAL DATA:  Right hip pain  EXAM: DG HIP (WITH OR WITHOUT PELVIS) 2-3V RIGHT  COMPARISON:  None.  FINDINGS: Mild degenerative change in  the right hip joint with joint space narrowing and mild spurring. Negative for fracture or AVN. No acute skeletal abnormality.  IMPRESSION: Mild degenerative change right hip   Electronically Signed   By: Franchot Gallo M.D.   On: 09/14/2017 09:23    Hip-L DG 2-3 views:  Results for orders placed during the hospital encounter of 09/13/17  DG HIP UNILAT W OR W/O PELVIS 2-3 VIEWS LEFT   Narrative CLINICAL DATA:  Low back pain and hip pain  EXAM: DG HIP (WITH OR WITHOUT PELVIS) 2-3V LEFT  COMPARISON:  None.  FINDINGS: Normal alignment. No fracture or mass. No AVN. Mild degenerative changes in the left hip joint with mild joint space narrowing and spurring.  IMPRESSION: Mild degenerative change left hip without acute abnormality.   Electronically Signed   By: Franchot Gallo M.D.   On: 09/14/2017 09:22    Knee Imaging: Knee-R DG 1-2 views:  Results for orders placed during the hospital encounter of 09/13/17  DG Knee 1-2 Views Right   Narrative CLINICAL DATA:  Chronic right knee pain.  EXAM: RIGHT KNEE - 1-2 VIEW  COMPARISON:  Right knee x-rays dated June 05, 2012.  FINDINGS: No acute fracture or dislocation. No significant joint effusion. New mild to moderate medial compartment joint space narrowing with marginal osteophytes. Small marginal lateral compartment osteophytes. Quadriceps and patellar enthesopathy. Bone mineralization is normal. Soft tissues are unremarkable.  IMPRESSION: 1. New mild to moderate medial compartment osteoarthritis.   Electronically Signed   By: Titus Dubin M.D.   On: 09/14/2017 09:23    Knee-L DG 1-2 views:  Results for orders placed during the hospital encounter of 09/13/17  DG Knee 1-2 Views Left   Narrative CLINICAL DATA:  57 year old female with a history of back pain  EXAM: LEFT KNEE - 1-2 VIEW  COMPARISON:  06/05/2012  FINDINGS: No acute displaced fracture. Medial greater than lateral joint space narrowing  with marginal osteophyte formation and subchondral sclerosis. Patellofemoral degenerative changes. No joint effusion. No focal soft tissue swelling. No radiopaque foreign body.  IMPRESSION: Negative for acute bony abnormality.  Tricompartmental osteoarthritis   Electronically Signed   By: Corrie Mckusick D.O.   On: 09/14/2017 08:50    Complexity Note: Imaging results reviewed. Results shared with Ms. Stankus, using Layman's terms.                         Meds   Current Outpatient Medications:  .  amLODipine (NORVASC) 5 MG tablet, Take 5 mg by mouth daily., Disp: , Rfl:  .  DULoxetine (CYMBALTA) 60 MG capsule, Take 60 mg by mouth daily., Disp: , Rfl:  .  ergocalciferol (VITAMIN D2) 50000 units capsule, Take 50,000 Units by  mouth once a week., Disp: , Rfl:  .  ferrous sulfate 325 (65 FE) MG tablet, Take 325 mg by mouth daily with breakfast., Disp: , Rfl:  .  gabapentin (NEURONTIN) 300 MG capsule, Take 1-3 capsules (300-900 mg total) by mouth 4 (four) times daily. Follow the written titration schedule., Disp: 360 capsule, Rfl: 2 .  metoprolol succinate (TOPROL-XL) 50 MG 24 hr tablet, Take 50 mg by mouth daily. Take with or immediately following a meal., Disp: , Rfl:  .  Multiple Vitamin (MULTIVITAMIN) tablet, Take 1 tablet by mouth daily., Disp: , Rfl:  .  olmesartan-hydrochlorothiazide (BENICAR HCT) 20-12.5 MG tablet, Take 1 tablet by mouth daily., Disp: , Rfl:  .  pantoprazole (PROTONIX) 40 MG tablet, Take 40 mg by mouth daily., Disp: , Rfl:  .  traMADol (ULTRAM) 50 MG tablet, Take 1-2 tablets (50-100 mg total) by mouth every 6 (six) hours as needed for severe pain., Disp: 240 tablet, Rfl: 0 .  traZODone (DESYREL) 50 MG tablet, Take 50 mg by mouth at bedtime., Disp: , Rfl:   ROS  Constitutional: Denies any fever or chills Gastrointestinal: No reported hemesis, hematochezia, vomiting, or acute GI distress Musculoskeletal: Denies any acute onset joint swelling, redness, loss of ROM, or  weakness Neurological: No reported episodes of acute onset apraxia, aphasia, dysarthria, agnosia, amnesia, paralysis, loss of coordination, or loss of consciousness  Allergies  Ms. Farfan is allergic to lisinopril.  [place]  Drug: Ms. Whitworth  reports that she does not use drugs. Alcohol:  reports that she does not drink alcohol. Tobacco:  reports that she has never smoked. She has never used smokeless tobacco. Medical:  has a past medical history of Depression, GERD (gastroesophageal reflux disease), and Hypertension. Surgical: Ms. Linderman  has a past surgical history that includes Colonoscopy with propofol (N/A, 01/09/2016); Esophagogastroduodenoscopy (egd) with propofol (N/A, 01/09/2016); and Cholecystectomy open. Family: family history includes Breast cancer in her maternal aunt.  Constitutional Exam  General appearance: Well nourished, well developed, and well hydrated. In no apparent acute distress Vitals:   11/02/17 1116  BP: (!) 143/76  Pulse: 66  Temp: 97.7 F (36.5 C)  SpO2: 100%  Weight: (!) 343 lb (155.6 kg)   BMI Assessment: Estimated body mass index is 58.88 kg/m as calculated from the following:   Height as of 09/13/17: '5\' 4"'  (1.626 m).   Weight as of this encounter: 343 lb (155.6 kg).  BMI interpretation table: BMI level Category Range association with higher incidence of chronic pain  <18 kg/m2 Underweight   18.5-24.9 kg/m2 Ideal body weight   25-29.9 kg/m2 Overweight Increased incidence by 20%  30-34.9 kg/m2 Obese (Class I) Increased incidence by 68%  35-39.9 kg/m2 Severe obesity (Class II) Increased incidence by 136%  >40 kg/m2 Extreme obesity (Class III) Increased incidence by 254%   Patient's current BMI Ideal Body weight  Body mass index is 58.88 kg/m. Ideal body weight: 54.7 kg (120 lb 9.5 oz) Adjusted ideal body weight: 95.1 kg (209 lb 8.9 oz)   BMI Readings from Last 4 Encounters:  11/02/17 58.88 kg/m  09/13/17 59.22 kg/m   Wt Readings from Last 4  Encounters:  11/02/17 (!) 343 lb (155.6 kg)  09/13/17 (!) 345 lb (156.5 kg)  Psych/Mental status: Alert, oriented x 3 (person, place, & time)       Eyes: PERLA Respiratory: No evidence of acute respiratory distress  Cervical Spine Area Exam  Skin & Axial Inspection: No masses, redness, edema, swelling, or associated skin lesions  Alignment: Symmetrical Functional ROM: Unrestricted ROM      Stability: No instability detected Muscle Tone/Strength: Functionally intact. No obvious neuro-muscular anomalies detected. Sensory (Neurological): Unimpaired Palpation: No palpable anomalies              Upper Extremity (UE) Exam    Side: Right upper extremity  Side: Left upper extremity  Skin & Extremity Inspection: Skin color, temperature, and hair growth are WNL. No peripheral edema or cyanosis. No masses, redness, swelling, asymmetry, or associated skin lesions. No contractures.  Skin & Extremity Inspection: Skin color, temperature, and hair growth are WNL. No peripheral edema or cyanosis. No masses, redness, swelling, asymmetry, or associated skin lesions. No contractures.  Functional ROM: Unrestricted ROM          Functional ROM: Unrestricted ROM          Muscle Tone/Strength: Functionally intact. No obvious neuro-muscular anomalies detected.  Muscle Tone/Strength: Functionally intact. No obvious neuro-muscular anomalies detected.  Sensory (Neurological): Unimpaired          Sensory (Neurological): Unimpaired          Palpation: No palpable anomalies              Palpation: No palpable anomalies              Provocative Test(s):  Phalen's test: deferred Tinel's test: deferred Apley's scratch test (touch opposite shoulder):  Action 1 (Across chest): deferred Action 2 (Overhead): deferred Action 3 (LB reach): deferred   Provocative Test(s):  Phalen's test: deferred Tinel's test: deferred Apley's scratch test (touch opposite shoulder):  Action 1 (Across chest): deferred Action 2 (Overhead):  deferred Action 3 (LB reach): deferred    Thoracic Spine Area Exam  Skin & Axial Inspection: No masses, redness, or swelling Alignment: Symmetrical Functional ROM: Unrestricted ROM Stability: No instability detected Muscle Tone/Strength: Functionally intact. No obvious neuro-muscular anomalies detected. Sensory (Neurological): Unimpaired Muscle strength & Tone: No palpable anomalies  Lumbar Spine Area Exam  Skin & Axial Inspection: No masses, redness, or swelling Alignment: Symmetrical Functional ROM: Decreased ROM       Stability: No instability detected Muscle Tone/Strength: Functionally intact. No obvious neuro-muscular anomalies detected. Sensory (Neurological): Movement-associated pain Palpation: Complains of area being tender to palpation       Provocative Tests: Lumbar Hyperextension/rotation test: (+) bilaterally for facet joint pain. Lumbar quadrant test (Kemp's test): (+) bilaterally for facet joint pain. Lumbar Lateral bending test: deferred today       Patrick's Maneuver: deferred today                   FABER test: deferred today       Thigh-thrust test: deferred today       S-I compression test: deferred today       S-I distraction test: deferred today        Gait & Posture Assessment  Ambulation: Limited Gait: Antalgic gait (limping) Posture: Antalgic   Lower Extremity Exam    Side: Right lower extremity  Side: Left lower extremity  Stability: No instability observed          Stability: No instability observed          Skin & Extremity Inspection: Skin color, temperature, and hair growth are WNL. No peripheral edema or cyanosis. No masses, redness, swelling, asymmetry, or associated skin lesions. No contractures.  Skin & Extremity Inspection: Skin color, temperature, and hair growth are WNL. No peripheral edema or cyanosis. No masses, redness, swelling, asymmetry, or associated skin  lesions. No contractures.  Functional ROM: Unrestricted ROM                   Functional ROM: Unrestricted ROM                  Muscle Tone/Strength: Functionally intact. No obvious neuro-muscular anomalies detected.  Muscle Tone/Strength: Functionally intact. No obvious neuro-muscular anomalies detected.  Sensory (Neurological): Unimpaired  Sensory (Neurological): Unimpaired  Palpation: No palpable anomalies  Palpation: No palpable anomalies   Assessment & Plan  Primary Diagnosis & Pertinent Problem List: The primary encounter diagnosis was Chronic pain syndrome. 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), Lumbar foraminal stenosis (Right: L2-3; Bilateral: L4-5), Chronic knee pain (Fourth Area of Pain) (Bilateral) (R>L), Osteoarthritis of knees (Bilateral), Chronic sacroiliac joint pain, Other specified dorsopathies, sacral and sacrococcygeal region, Generalized osteoarthritis of multiple sites, Polyneuropathy, Neurogenic pain, Pharmacologic therapy, Disorder of skeletal system, Problems influencing health status, Vitamin D deficiency, unspecified, Elevated erythrocyte sedimentation rate, Obstructive sleep apnea, Abnormal MRI, lumbar spine, and Elevated C-reactive protein (CRP) were also pertinent to this visit.  Visit Diagnosis: 1. Chronic pain syndrome   2. Chronic low back pain (Primary Area of Pain) (Bilateral) (Midline) (L>R) w/ sciatica (Bilateral)   3. DDD (degenerative disc disease), lumbar   4. Anterolisthesis (L2-3 and L4-5)   5. Spondylosis without myelopathy or radiculopathy, lumbosacral  region   6. Lumbar facet arthropathy (L2-3, L3-4, L4-5) (Bilateral)   7. Chronic hip pain (Secondary Area of Pain) (Bilateral) (L>R)   8. Osteoarthritis of hips (Bilateral)   9. Chronic lower extremity pain (Tertiary Area of Pain) (Bilateral) (L>R)   10. 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 Lumbar foraminal stenosis (Right: L2-3; Bilateral: L4-5)   12. Chronic knee pain (Fourth Area of Pain) (Bilateral) (R>L)   13. Osteoarthritis of knees (Bilateral)   14. Chronic sacroiliac joint pain   15. Other specified dorsopathies, sacral and sacrococcygeal region   16. Generalized osteoarthritis of multiple sites   17. Polyneuropathy   18. Neurogenic pain   19. Pharmacologic therapy   20. Disorder of skeletal system   21. Problems influencing health status   22. Vitamin D deficiency, unspecified   23. Elevated erythrocyte sedimentation rate   24. Obstructive sleep apnea   25. Abnormal MRI, lumbar spine   26. Elevated C-reactive protein (CRP)    Problems updated and reviewed during this visit: No problems updated.  Plan of Care  Pharmacotherapy (Medications Ordered): Meds ordered this encounter  Medications  . traMADol (ULTRAM) 50 MG tablet    Sig: Take 1-2 tablets (50-100 mg total) by mouth every 6 (six) hours as needed for severe pain.    Dispense:  240 tablet    Refill:  0    Do not place this medication, or any other prescription from our practice, on "Automatic Refill". Patient may have prescription filled one day early if pharmacy is closed on scheduled refill date. Do not fill until:  To last until:  . gabapentin (NEURONTIN) 300 MG capsule    Sig: Take 1-3 capsules (300-900 mg total) by mouth 4 (four) times daily. Follow the written  titration schedule.    Dispense:  360 capsule    Refill:  2    Do not place this medication, or any other prescription from our practice, on "Automatic Refill". Patient may have prescription filled one day early if pharmacy is  closed on scheduled refill date.    Procedure Orders     LUMBAR FACET(MEDIAL BRANCH NERVE BLOCK) MBNB  Lab Orders     ANA w/Reflex if Positive  Imaging Orders     DG Lumbar Spine Complete W/Bend Referral Orders  No referral(s) requested today    Pharmacological management options:  Opioid Analgesics: We'll take over management today. See above orders Membrane stabilizer: We have discussed the possibility of optimizing this mode of therapy, if tolerated Muscle relaxant: We have discussed the possibility of a trial NSAID: We have discussed the possibility of a trial Other analgesic(s): To be determined at a later time   Interventional management options: Planned, scheduled, and/or pending:    Diagnostic bilateral lumbar facet block #1    Considering:   Diagnostic bilateral lumbar facet block  Possible bilateral lumbar facet RFA  Diagnostic bilateral sacroiliac joint block  Possible bilateral sacroiliac joint RFA  Diagnostic L2-3 LESI  Diagnostic L3-4 LESI  Diagnostic L4-5 LESI  Diagnostic right-sided L2-3 transforaminal ESI  Diagnostic bilateral L4-5 transforaminal ESI  Diagnostic bilateral intra-articular hip joint injection  Diagnostic bilateral femoral nerve + obturator nerve block  Possible bilateral femoral nerve + obturator nerve RFA  Diagnostic bilateral intra-articular knee injection with local anesthetic and steroid  Possible bilateral, series of 5, intra-articular Hyalgan knee injections  Diagnostic bilateral genicular nerve block  Possible bilateral genicular nerve RFA    PRN Procedures:   None at this time   Provider-requested follow-up: Return for Procedure (w/ sedation): (B) L-FCT BLK #1.  Future Appointments  Date Time Provider [place]  11/17/2017  8:15 AM Milinda Pointer, MD [facility] None  11/23/2017  2:00 PM [facility]    Primary Care Physician: Tracie Harrier, MD Location: [facility]  Facility Note by: Gaspar Cola, MD Date: 11/02/2017; Time: 12:40 PM

## 2017-11-02 ENCOUNTER — Other Ambulatory Visit: Payer: Self-pay

## 2017-11-02 ENCOUNTER — Ambulatory Visit
Admission: RE | Admit: 2017-11-02 | Discharge: 2017-11-02 | Disposition: A | Discharge status: Home / Self Care | Payer: BLUE CROSS/BLUE SHIELD | Source: Ambulatory Visit | Attending: Pain Medicine | Admitting: Pain Medicine

## 2017-11-02 ENCOUNTER — Encounter: Payer: Self-pay | Admitting: Pain Medicine

## 2017-11-02 ENCOUNTER — Other Ambulatory Visit: Payer: Self-pay | Admitting: Internal Medicine

## 2017-11-02 ENCOUNTER — Ambulatory Visit: Payer: BLUE CROSS/BLUE SHIELD | Attending: Pain Medicine | Admitting: Pain Medicine

## 2017-11-02 VITALS — BP 143/76 | HR 66 | Temp 97.7°F | Wt 343.0 lb

## 2017-11-02 DIAGNOSIS — M5441 Lumbago with sciatica, right side: Principal | ICD-10-CM

## 2017-11-02 DIAGNOSIS — M79605 Pain in left leg: Secondary | ICD-10-CM

## 2017-11-02 DIAGNOSIS — M47816 Spondylosis without myelopathy or radiculopathy, lumbar region: Secondary | ICD-10-CM

## 2017-11-02 DIAGNOSIS — M5442 Lumbago with sciatica, left side: Secondary | ICD-10-CM | POA: Insufficient documentation

## 2017-11-02 DIAGNOSIS — F329 Major depressive disorder, single episode, unspecified: Secondary | ICD-10-CM | POA: Diagnosis not present

## 2017-11-02 DIAGNOSIS — Z9889 Other specified postprocedural states: Secondary | ICD-10-CM | POA: Diagnosis not present

## 2017-11-02 DIAGNOSIS — M533 Sacrococcygeal disorders, not elsewhere classified: Secondary | ICD-10-CM

## 2017-11-02 DIAGNOSIS — M5136 Other intervertebral disc degeneration, lumbar region: Secondary | ICD-10-CM | POA: Insufficient documentation

## 2017-11-02 DIAGNOSIS — M25561 Pain in right knee: Secondary | ICD-10-CM | POA: Diagnosis not present

## 2017-11-02 DIAGNOSIS — E559 Vitamin D deficiency, unspecified: Secondary | ICD-10-CM | POA: Diagnosis not present

## 2017-11-02 DIAGNOSIS — Z79899 Other long term (current) drug therapy: Secondary | ICD-10-CM

## 2017-11-02 DIAGNOSIS — G8929 Other chronic pain: Secondary | ICD-10-CM | POA: Diagnosis present

## 2017-11-02 DIAGNOSIS — M4310 Spondylolisthesis, site unspecified: Secondary | ICD-10-CM

## 2017-11-02 DIAGNOSIS — Z789 Other specified health status: Secondary | ICD-10-CM

## 2017-11-02 DIAGNOSIS — K219 Gastro-esophageal reflux disease without esophagitis: Secondary | ICD-10-CM | POA: Diagnosis not present

## 2017-11-02 DIAGNOSIS — M47817 Spondylosis without myelopathy or radiculopathy, lumbosacral region: Secondary | ICD-10-CM

## 2017-11-02 DIAGNOSIS — M160 Bilateral primary osteoarthritis of hip: Secondary | ICD-10-CM | POA: Diagnosis not present

## 2017-11-02 DIAGNOSIS — M545 Low back pain: Secondary | ICD-10-CM | POA: Insufficient documentation

## 2017-11-02 DIAGNOSIS — G4733 Obstructive sleep apnea (adult) (pediatric): Secondary | ICD-10-CM

## 2017-11-02 DIAGNOSIS — M51369 Other intervertebral disc degeneration, lumbar region without mention of lumbar back pain or lower extremity pain: Secondary | ICD-10-CM

## 2017-11-02 DIAGNOSIS — M25551 Pain in right hip: Secondary | ICD-10-CM

## 2017-11-02 DIAGNOSIS — M159 Polyosteoarthritis, unspecified: Secondary | ICD-10-CM

## 2017-11-02 DIAGNOSIS — R7982 Elevated C-reactive protein (CRP): Secondary | ICD-10-CM | POA: Diagnosis not present

## 2017-11-02 DIAGNOSIS — M25559 Pain in unspecified hip: Secondary | ICD-10-CM | POA: Insufficient documentation

## 2017-11-02 DIAGNOSIS — R700 Elevated erythrocyte sedimentation rate: Secondary | ICD-10-CM

## 2017-11-02 DIAGNOSIS — M170 Bilateral primary osteoarthritis of knee: Secondary | ICD-10-CM | POA: Insufficient documentation

## 2017-11-02 DIAGNOSIS — M48061 Spinal stenosis, lumbar region without neurogenic claudication: Secondary | ICD-10-CM | POA: Insufficient documentation

## 2017-11-02 DIAGNOSIS — G894 Chronic pain syndrome: Secondary | ICD-10-CM

## 2017-11-02 DIAGNOSIS — Z79891 Long term (current) use of opiate analgesic: Secondary | ICD-10-CM | POA: Insufficient documentation

## 2017-11-02 DIAGNOSIS — M9983 Other biomechanical lesions of lumbar region: Secondary | ICD-10-CM | POA: Diagnosis not present

## 2017-11-02 DIAGNOSIS — M5388 Other specified dorsopathies, sacral and sacrococcygeal region: Secondary | ICD-10-CM

## 2017-11-02 DIAGNOSIS — Z1231 Encounter for screening mammogram for malignant neoplasm of breast: Secondary | ICD-10-CM

## 2017-11-02 DIAGNOSIS — M899 Disorder of bone, unspecified: Secondary | ICD-10-CM

## 2017-11-02 DIAGNOSIS — G629 Polyneuropathy, unspecified: Secondary | ICD-10-CM

## 2017-11-02 DIAGNOSIS — R937 Abnormal findings on diagnostic imaging of other parts of musculoskeletal system: Secondary | ICD-10-CM

## 2017-11-02 DIAGNOSIS — M79604 Pain in right leg: Secondary | ICD-10-CM | POA: Diagnosis not present

## 2017-11-02 DIAGNOSIS — M25552 Pain in left hip: Secondary | ICD-10-CM

## 2017-11-02 DIAGNOSIS — M25562 Pain in left knee: Secondary | ICD-10-CM

## 2017-11-02 DIAGNOSIS — M792 Neuralgia and neuritis, unspecified: Secondary | ICD-10-CM | POA: Insufficient documentation

## 2017-11-02 MED ORDER — TRAMADOL HCL 50 MG PO TABS: 50.0000 mg | ORAL_TABLET | Freq: Four times a day (QID) | ORAL | 0 refills | Status: DC | PRN

## 2017-11-02 MED ORDER — GABAPENTIN 300 MG PO CAPS: 300.0000 mg | ORAL_CAPSULE | Freq: Four times a day (QID) | ORAL | 2 refills | Status: DC

## 2017-11-02 NOTE — Patient Instructions (Addendum)
____________________________________________________________________________________________  Preparing for Procedure with Sedation  Instructions: . Oral Intake: Do not eat or drink anything for at least 8 hours prior to your procedure. . Transportation: Public transportation is not allowed. Bring an adult driver. The driver must be physically present in our waiting room before any procedure can be started. Marland Kitchen Physical Assistance: Bring an adult physically capable of assisting you, in the event you need help. This adult should keep you company at home for at least 6 hours after the procedure. . Blood Pressure Medicine: Take your blood pressure medicine with a sip of water the morning of the procedure. . Blood thinners:  . Diabetics on insulin: Notify the staff so that you can be scheduled 1st case in the morning. If your diabetes requires high dose insulin, take only  of your normal insulin dose the morning of the procedure and notify the staff that you have done so. . Preventing infections: Shower with an antibacterial soap the morning of your procedure. . Build-up your immune system: Take 1000 mg of Vitamin C with every meal (3 times a day) the day prior to your procedure. Marland Kitchen Antibiotics: Inform the staff if you have a condition or reason that requires you to take antibiotics before dental procedures. . Pregnancy: If you are pregnant, call and cancel the procedure. . Sickness: If you have a cold, fever, or any active infections, call and cancel the procedure. . Arrival: You must be in the facility at least 30 minutes prior to your scheduled procedure. . Children: Do not bring children with you. . Dress appropriately: Bring dark clothing that you would not mind if they get stained. . Valuables: Do not bring any jewelry or valuables.  Procedure appointments are reserved for interventional treatments only. Marland Kitchen No Prescription Refills. . No medication changes will be discussed during procedure  appointments. . No disability issues will be discussed.  Remember:  Regular Business hours are:  Monday to Thursday 8:00 AM to 4:00 PM  Provider's Schedule: Milinda Pointer, MD:  Procedure days: Tuesday and Thursday 7:30 AM to 4:00 PM  Gillis Santa, MD:  Procedure days: Monday and Wednesday 7:30 AM to 4:00 PM ____________________________________________________________________________________________   ____________________________________________________________________________________________  Medication Rules  Applies to: All patients receiving prescriptions (written or electronic).  Pharmacy of record: Pharmacy where electronic prescriptions will be sent. If written prescriptions are taken to a different pharmacy, please inform the nursing staff. The pharmacy listed in the electronic medical record should be the one where you would like electronic prescriptions to be sent.  Prescription refills: Only during scheduled appointments. Applies to both, written and electronic prescriptions.  NOTE: The following applies primarily to controlled substances (Opioid* Pain Medications).   Patient's responsibilities: 1. Pain Pills: Bring all pain pills to every appointment (except for procedure appointments). 2. Pill Bottles: Bring pills in original pharmacy bottle. Always bring newest bottle. Bring bottle, even if empty. 3. Medication refills: You are responsible for knowing and keeping track of what medications you need refilled. The day before your appointment, write a list of all prescriptions that need to be refilled. Bring that list to your appointment and give it to the admitting nurse. Prescriptions will be written only during appointments. If you forget a medication, it will not be "Called in", "Faxed", or "electronically sent". You will need to get another appointment to get these prescribed. 4. Prescription Accuracy: You are responsible for carefully inspecting your prescriptions  before leaving our office. Have the discharge nurse carefully go over each prescription  with you, before taking them home. Make sure that your name is accurately spelled, that your address is correct. Check the name and dose of your medication to make sure it is accurate. Check the number of pills, and the written instructions to make sure they are clear and accurate. Make sure that you are given enough medication to last until your next medication refill appointment. 5. Taking Medication: Take medication as prescribed. Never take more pills than instructed. Never take medication more frequently than prescribed. Taking less pills or less frequently is permitted and encouraged, when it comes to controlled substances (written prescriptions).  6. Inform other Doctors: Always inform, all of your healthcare providers, of all the medications you take. 7. Pain Medication from other Providers: You are not allowed to accept any additional pain medication from any other Doctor or Healthcare provider. There are two exceptions to this rule. (see below) In the event that you require additional pain medication, you are responsible for notifying us, as stated below. 8. Medication Agreement: You are responsible for carefully reading and following our Medication Agreement. This must be signed before receiving any prescriptions from our practice. Safely store a copy of your signed Agreement. Violations to the Agreement will result in no further prescriptions. (Additional copies of our Medication Agreement are available upon request.) 9. Laws, Rules, & Regulations: All patients are expected to follow all Federal and [employer], [employer], Rules, [employer]. Ignorance of the Laws does not constitute a valid excuse. The use of any illegal substances is prohibited. 10. Adopted CDC guidelines & recommendations: Target dosing levels will be at or below 60 MME/day. Use of benzodiazepines** is not recommended.  Exceptions: There  are only two exceptions to the rule of not receiving pain medications from other Healthcare Providers. 1. Exception #1 (Emergencies): In the event of an emergency (i.e.: accident requiring emergency care), you are allowed to receive additional pain medication. However, you are responsible for: As soon as you are able, call our office [phone], at any time of the day or night, and leave a message stating your name, the date and nature of the emergency, and the name and dose of the medication prescribed. In the event that your call is answered by a member of our staff, make sure to document and save the date, time, and the name of the person that took your information.  2. Exception #2 (Planned Surgery): In the event that you are scheduled by another doctor or dentist to have any type of surgery or procedure, you are allowed (for a period no longer than 30 days), to receive additional pain medication, for the acute post-op pain. However, in this case, you are responsible for picking up a copy of our "Post-op Pain Management for Surgeons" handout, and giving it to your surgeon or dentist. This document is available at our office, and does not require an appointment to obtain it. Simply go to our office during business hours (Monday-Thursday from 8:00 AM to 4:00 PM) (Friday 8:00 AM to 12:00 Noon) or if you have a scheduled appointment with [place], prior to your surgery, and ask for it by name. In addition, you will need to provide [place] with your name, name of your surgeon, type of surgery, and date of procedure or surgery.  *Opioid medications include: morphine, codeine, oxycodone, oxymorphone, hydrocodone, hydromorphone, meperidine, tramadol, tapentadol, buprenorphine, fentanyl, methadone. **Benzodiazepine medications include: diazepam (Valium), alprazolam (Xanax), clonazepam (Klonopine), lorazepam (Ativan), clorazepate (Tranxene), chlordiazepoxide (Librium), estazolam (Prosom), oxazepam (Serax), temazepam  (  Restoril), triazolam (Halcion) (Last updated: 07/21/2017) ____________________________________________________________________________________________   ____________________________________________________________________________________________  Medication Recommendations and Reminders  Applies to: All patients receiving prescriptions (written and/or electronic).  Medication Rules & Regulations: These rules and regulations exist for your safety and that of others. They are not flexible and neither are we. Dismissing or ignoring them will be considered "non-compliance" with medication therapy, resulting in complete and irreversible termination of such therapy. (See document titled "Medication Rules" for more details.) In all conscience, because of safety reasons, we cannot continue providing a therapy where the patient does not follow instructions.  Pharmacy of record:   Definition: This is the pharmacy where your electronic prescriptions will be sent.   We do not endorse any particular pharmacy.  You are not restricted in your choice of pharmacy.  The pharmacy listed in the electronic medical record should be the one where you want electronic prescriptions to be sent.  If you choose to change pharmacy, simply notify our nursing staff of your choice of new pharmacy.  Recommendations:  Keep all of your pain medications in a safe place, under lock and key, even if you live alone.   After you fill your prescription, take 1 week's worth of pills and put them away in a safe place. You should keep a separate, properly labeled bottle for this purpose. The remainder should be kept in the original bottle. Use this as your primary supply, until it runs out. Once it's gone, then you know that you have 1 week's worth of medicine, and it is time to come in for a prescription refill. If you do this correctly, it is unlikely that you will ever run out of medicine.  To make sure that the above  recommendation works, it is very important that you make sure your medication refill appointments are scheduled at least 1 week before you run out of medicine. To do this in an effective manner, make sure that you do not leave the office without scheduling your next medication management appointment. Always ask the nursing staff to show you in your prescription , when your medication will be running out. Then arrange for the receptionist to get you a return appointment, at least 7 days before you run out of medicine. Do not wait until you have 1 or 2 pills left, to come in. This is very poor planning and does not take into consideration that we may need to cancel appointments due to bad weather, sickness, or emergencies affecting our staff.  Prescription refills and/or changes in medication(s):   Prescription refills, and/or changes in dose or medication, will be conducted only during scheduled medication management appointments. (Applies to both, written and electronic prescriptions.)  No refills on procedure days. No medication will be changed or started on procedure days. No changes, adjustments, and/or refills will be conducted on a procedure day. Doing so will interfere with the diagnostic portion of the procedure.  No phone refills. No medications will be "called into the pharmacy".  No Fax refills.  No weekend refills.  No Holliday refills.  No after hours refills.  Remember:  Business hours are:  Monday to Thursday 8:00 AM to 4:00 PM Provider's Schedule: Dionisio David, NP - Appointments are:  Medication management: Monday to Thursday 8:00 AM to 4:00 PM Milinda Pointer, MD - Appointments are:  Medication management: Monday and Wednesday 8:00 AM to 4:00 PM Procedure day: Tuesday and Thursday 7:30 AM to 4:00 PM Gillis Santa, MD - Appointments are:  Medication management: Tuesday and  Thursday 8:00 AM to 4:00 PM Procedure day: Monday and Wednesday 7:30 AM to 4:00 PM (Last update:  07/21/2017) ____________________________________________________________________________________________   ____________________________________________________________________________________________  CANNABIDIOL (AKA: CBD Oil or Pills)  Applies to: All patients receiving prescriptions of controlled substances (written and/or electronic).  General Information: Cannabidiol (CBD) was discovered in 16. It is one of some 113 identified cannabinoids in cannabis (Marijuana) plants, accounting for up to 40% of the plant's extract. As of 2018, preliminary clinical research on cannabidiol included studies of anxiety, cognition, movement disorders, and pain.  Cannabidiol is consummed in multiple ways, including inhalation of cannabis smoke or vapor, as an aerosol spray into the cheek, and by mouth. It may be supplied as CBD oil containing CBD as the active ingredient (no added tetrahydrocannabinol (THC) or terpenes), a full-plant CBD-dominant hemp extract oil, capsules, dried cannabis, or as a liquid solution. CBD is thought not have the same psychoactivity as THC, and may affect the actions of THC. Studies suggest that CBD may interact with different biological targets, including cannabinoid receptors and other neurotransmitter receptors. As of 2018 the mechanism of action for its biological effects has not been determined.  In the [place], cannabidiol has a limited approval by the Food and Drug Administration (FDA) for treatment of only two types of epilepsy disorders. The side effects of long-term use of the drug include somnolence, decreased appetite, diarrhea, fatigue, malaise, weakness, sleeping problems, and others.  CBD remains a Schedule I drug prohibited for any use.  Legality: Some manufacturers ship CBD products nationally, an illegal action which the FDA has not enforced in 2018, with CBD remaining the subject of an FDA investigational new drug evaluation, and is not considered legal as  a dietary supplement or food ingredient as of December 2018. Federal illegality has made it difficult historically to conduct research on CBD. CBD is openly sold in head shops and health food stores in some states where such sales have not been explicitly legalized.  Warning: Because it is not FDA approved for general use or treatment of pain, it is not required to undergo the same manufacturing controls as prescription drugs.  This means that the available cannabidiol (CBD) may be contaminated with THC.  If this is the case, it will trigger a positive urine drug screen (UDS) test for cannabinoids (Marijuana).  Because a positive UDS for illicit substances is a violation of our medication agreement, your opioid analgesics (pain medicine) may be permanently discontinued. (Last update: 08/11/2017) ____________________________________________________________________________________________   ____________________________________________________________________________________________  Gabapentin Titration  Medication used: Gabapentin (Generic Name) or Neurontin (Brand Name) 300 mg tablets/capsules  Reasons to stop increasing the dose:  Reason 1: You get good relief of symptoms, in which case there is no need to increase the daily dose any further.    Reason 2: You develop some side effects, such as sleeping all of the time, difficulty concentrating, or becoming disoriented, in which case you need to go down on the dose, to the prior level, where you were not experiencing any side effects. Stay on that dose longer, to allow more time for your body to get use it, before attempting to increase it again.   Reasons to stop increasing the dose: Reason 1: You get good relief of symptoms, in which case there is no need to increase the daily dose any further.  Reason 2: You develop some side effects, such as sleeping all of the time, difficulty concentrating, or becoming disoriented, in which case you need  to go down  on the dose, to the prior level, where you were not experiencing any side effects. Stay on that dose longer, to allow more time for your body to get use it, before attempting to increase it again.  Steps to increase medication: Step 1: Start by taking 1 (one) tablet at bedtime x 7 (seven) days.  Step 2: After 7 (seven) days of taking 1 (one) tablet at bedtime, increase it to 2 (two) tablets at bedtime. Stay on this dose x 7 (seven) days.  Step 3: After 7 (seven) days of taking 2 (two) tablets at bedtime, increase it to 3 (three) tablets at bedtime. Stay on this dose x another 7 (seven) days.  Step 4: After 7 (seven) days of taking 3 (three) tablet at bedtime, begin taking 1 (one) tablet at noon with lunch. Stay on this dose x another 7 (seven) days.  Step 5: After 7 (seven) days of taking 3 (three) tablet at bedtime, and 1 (one) tablet at noon, then begin taking 1 (one) tablet in the afternoon with dinner. Stay on this dose x another 7 (seven) days.  Step 6: After 7 (seven) days of taking 3 (three) tablet at bedtime, 1 (one) tablet at noon, and 1 (one) tablet in the afternoon, then begin taking 1 (one) tablet in the morning with breakfast. Stay on this dose x another 7 (seven) days. At this point you should be taking the medicine 4 (four) times a day, or about every 6 (six) hours. This daily regimen of taking the medicine 4 (four) times a day, will be maintained from now on. You should not take any doses any sooner than every 6 (six) hours.  Step 7: After 7 (seven) days of taking 3 (three) tablet at bedtime, 1 (one) tablet at noon, 1 (one) tablet in the afternoon, and 1 (one) tablet in the morning, begin taking 2 (two) tablets at noon with lunch. Stay on this dose x another 7 (seven) days.   Step 8: After 7 (seven) days of taking 3 (three) tablet at bedtime, 2 (two) tablets at noon, 1 (one) tablet in the afternoon, and 1 (one) tablet in the morning, begin taking 2 (two) tablets in the  afternoon with dinner. Stay on this dose x another 7 (seven) days.   Step 9: After 7 (seven) days of taking 3 (three) tablet at bedtime, 2 (two) tablets at noon, 2 (two) tablets in the afternoon, and 1 (one) tablet in the morning, begin taking 2 (two) tablets in the morning with breakfast. Stay on this dose x another 7 (seven) days. At this point you should be taking the medicine 4 (four) times a day, or about every 6 (six) hours. This daily regimen of taking the medicine 4 (four) times a day, will be maintained from now on. You should not take any doses any sooner than every 6 (six) hours.  Step 10: After 7 (seven) days of taking 3 (three) tablet at bedtime, 2 (two) tablets at noon, 2 (two) tablets in the afternoon, and 2 (two) tablets in the morning, begin taking 3 (three) tablets at noon with lunch. Stay on this dose x another 7 (seven) days.   Step 11: After 7 (seven) days of taking 3 (three) tablet at bedtime, 3 (three) tablets at noon, 2 (two) tablets in the afternoon, and 2 (two) tablets in the morning, begin taking 3 (three) tablets in the afternoon with dinner. Stay on this dose x another 7 (seven) days.   Step 12: After 7 (  seven) days of taking 3 (three) tablet at bedtime, 3 (three) tablets at noon, 3 (three) tablets in the afternoon, and 2 (two) tablet in the morning, begin taking 3 (three) tablets in the morning with breakfast. Stay on this dose x another 7 (seven) days. At this point you should be taking the medicine 4 (four) times a day, or about every 6 (six) hours. This daily regimen of taking the medicine 4 (four) times a day, will be maintained from now on.   Endpoint: Once you have reached the maximum dose you can tolerate without side-effects, contact your physician so as to evaluate the results of the regimen.   Questions: Feel free to contact us for any questions or problems at [phone]  [phone] ____________________________________________________________________________________________  Facet Blocks Patient Information  Description: The facets are joints in the spine between the vertebrae.  Like any joints in the body, facets can become irritated and painful.  Arthritis can also effect the facets.  By injecting steroids and local anesthetic in and around these joints, we can temporarily block the nerve supply to them.  Steroids act directly on irritated nerves and tissues to reduce selling and inflammation which often leads to decreased pain.  Facet blocks may be done anywhere along the spine from the neck to the low back depending upon the location of your pain.   After numbing the skin with local anesthetic (like Novocaine), a small needle is passed onto the facet joints under x-ray guidance.  You may experience a sensation of pressure while this is being done.  The entire block usually lasts about 15-25 minutes.   Conditions which may be treated by facet blocks:   Low back/buttock pain  Neck/shoulder pain  Certain types of headaches  Preparation for the injection:  1. Do not eat any solid food or dairy products within 8 hours of your appointment. 2. You may drink clear liquid up to 3 hours before appointment.  Clear liquids include water, black coffee, juice or soda.  No milk or cream please. 3. You may take your regular medication, including pain medications, with a sip of water before your appointment.  Diabetics should hold regular insulin (if taken separately) and take 1/2 normal NPH dose the morning of the procedure.  Carry some sugar containing items with you to your appointment. 4. A driver must accompany you and be prepared to drive you home after your procedure. 5. Bring all your current medications with you. 6. An IV may be inserted and sedation may be given at the discretion of the physician. 7. A blood pressure cuff, EKG and other monitors will often be  applied during the procedure.  Some patients may need to have extra oxygen administered for a short period. 8. You will be asked to provide medical information, including your allergies and medications, prior to the procedure.  We must know immediately if you are taking blood thinners (like Coumadin/Warfarin) or if you are allergic to IV iodine contrast (dye).  We must know if you could possible be pregnant.  Possible side-effects:   Bleeding from needle site  Infection (rare, may require surgery)  Nerve injury (rare)  Numbness & tingling (temporary)  Difficulty urinating (rare, temporary)  Spinal headache (a headache worse with upright posture)  Light-headedness (temporary)  Pain at injection site (serveral days)  Decreased blood pressure (rare, temporary)  Weakness in arm/leg (temporary)  Pressure sensation in back/neck (temporary)   Call if you experience:   Fever/chills associated with headache or increased  back/neck pain  Headache worsened by an upright position  New onset, weakness or numbness of an extremity below the injection site  Hives or difficulty breathing (go to the emergency room)  Inflammation or drainage at the injection site(s)  Severe back/neck pain greater than usual  New symptoms which are concerning to you  Please note:  Although the local anesthetic injected can often make your back or neck feel good for several hours after the injection, the pain will likely return. It takes 3-7 days for steroids to work.  You may not notice any pain relief for at least one week.  If effective, we will often do a series of 2-3 injections spaced 3-6 weeks apart to maximally decrease your pain.  After the initial series, you may be a candidate for a more permanent nerve block of the facets.  If you have any questions, please call #[phone] [facility]  Tramadol prescription given.

## 2017-11-07 ENCOUNTER — Encounter: Payer: Self-pay | Admitting: Pain Medicine

## 2017-11-07 ENCOUNTER — Other Ambulatory Visit: Payer: Self-pay | Admitting: Pain Medicine

## 2017-11-07 DIAGNOSIS — M532X6 Spinal instabilities, lumbar region: Secondary | ICD-10-CM

## 2017-11-07 DIAGNOSIS — M4310 Spondylolisthesis, site unspecified: Secondary | ICD-10-CM

## 2017-11-17 ENCOUNTER — Ambulatory Visit
Admission: RE | Admit: 2017-11-17 | Discharge: 2017-11-17 | Disposition: A | Discharge status: Home / Self Care | Payer: BLUE CROSS/BLUE SHIELD | Source: Ambulatory Visit | Attending: Pain Medicine | Admitting: Pain Medicine

## 2017-11-17 ENCOUNTER — Ambulatory Visit (HOSPITAL_BASED_OUTPATIENT_CLINIC_OR_DEPARTMENT_OTHER): Payer: BLUE CROSS/BLUE SHIELD | Admitting: Pain Medicine

## 2017-11-17 ENCOUNTER — Other Ambulatory Visit: Payer: Self-pay

## 2017-11-17 ENCOUNTER — Encounter: Payer: Self-pay | Admitting: Pain Medicine

## 2017-11-17 VITALS — BP 136/74 | HR 62 | Temp 98.1°F | Resp 21 | Ht 64.0 in | Wt 345.0 lb

## 2017-11-17 DIAGNOSIS — M545 Low back pain, unspecified: Secondary | ICD-10-CM | POA: Insufficient documentation

## 2017-11-17 DIAGNOSIS — M5441 Lumbago with sciatica, right side: Secondary | ICD-10-CM

## 2017-11-17 DIAGNOSIS — M25561 Pain in right knee: Secondary | ICD-10-CM | POA: Diagnosis present

## 2017-11-17 DIAGNOSIS — M47817 Spondylosis without myelopathy or radiculopathy, lumbosacral region: Secondary | ICD-10-CM | POA: Insufficient documentation

## 2017-11-17 DIAGNOSIS — M1288 Other specific arthropathies, not elsewhere classified, other specified site: Secondary | ICD-10-CM | POA: Insufficient documentation

## 2017-11-17 DIAGNOSIS — G894 Chronic pain syndrome: Secondary | ICD-10-CM

## 2017-11-17 DIAGNOSIS — M4310 Spondylolisthesis, site unspecified: Secondary | ICD-10-CM

## 2017-11-17 DIAGNOSIS — M47816 Spondylosis without myelopathy or radiculopathy, lumbar region: Secondary | ICD-10-CM | POA: Insufficient documentation

## 2017-11-17 DIAGNOSIS — Z79899 Other long term (current) drug therapy: Secondary | ICD-10-CM | POA: Diagnosis not present

## 2017-11-17 DIAGNOSIS — G8929 Other chronic pain: Secondary | ICD-10-CM | POA: Insufficient documentation

## 2017-11-17 DIAGNOSIS — M5442 Lumbago with sciatica, left side: Secondary | ICD-10-CM

## 2017-11-17 MED ORDER — TRAMADOL HCL 50 MG PO TABS: 50.0000 mg | ORAL_TABLET | Freq: Four times a day (QID) | ORAL | 0 refills | Status: DC | PRN

## 2017-11-17 MED ORDER — FENTANYL CITRATE (PF) 100 MCG/2ML IJ SOLN
25.0000 ug | INTRAMUSCULAR | Status: DC | PRN
  Administered 2017-11-17: 100 ug via INTRAVENOUS
  Filled 2017-11-17: qty 2

## 2017-11-17 MED ORDER — MIDAZOLAM HCL 5 MG/5ML IJ SOLN
1.0000 mg | INTRAMUSCULAR | Status: DC | PRN
  Administered 2017-11-17: 3 mg via INTRAVENOUS
  Filled 2017-11-17: qty 5

## 2017-11-17 MED ORDER — LIDOCAINE HCL 2 % IJ SOLN
20.0000 mL | Freq: Once | INTRAMUSCULAR | Status: AC
  Administered 2017-11-17: 400 mg
  Filled 2017-11-17: qty 20

## 2017-11-17 MED ORDER — TRIAMCINOLONE ACETONIDE 40 MG/ML IJ SUSP
80.0000 mg | Freq: Once | INTRAMUSCULAR | Status: AC
  Administered 2017-11-17: 40 mg
  Filled 2017-11-17: qty 2

## 2017-11-17 MED ORDER — ROPIVACAINE HCL 2 MG/ML IJ SOLN
18.0000 mL | Freq: Once | INTRAMUSCULAR | Status: AC
  Administered 2017-11-17: 10 mL via PERINEURAL
  Filled 2017-11-17: qty 20

## 2017-11-17 MED ORDER — LACTATED RINGERS IV SOLN
1000.0000 mL | Freq: Once | INTRAVENOUS | Status: AC
  Administered 2017-11-17: 1000 mL via INTRAVENOUS

## 2017-11-17 NOTE — Progress Notes (Signed)
Patient's Name: Sabrina Castro  MRN: [id number]  Referring Provider: Milinda Pointer, MD  DOB: 05-04-1961  PCP: Tracie Harrier, MD  DOS: 11/17/2017  Note by: Gaspar Cola, MD  Service setting: Ambulatory outpatient  Specialty: Interventional Pain Management  Patient type: Established  Location: [facility] (AMB) Pain Management Facility  Visit type: Interventional Procedure   Primary Reason for Visit: Interventional Pain Management Treatment. CC: Back Pain (low) and Knee Pain (right)  Procedure:          Anesthesia, Analgesia, Anxiolysis:  Type: Lumbar Facet, Medial Branch Block(s) #1  Primary Purpose: Diagnostic Region: Posterolateral Lumbosacral Spine Level: L2, L3, L4, L5, & S1 Medial Branch Level(s). Injecting these levels blocks the L3-4, L4-5, and L5-S1 lumbar facet joints. Laterality: Bilateral  Type: Moderate (Conscious) Sedation combined with Local Anesthesia Indication(s): Analgesia and Anxiety Route: Intravenous (IV) IV Access: Secured Sedation: Meaningful verbal contact was maintained at all times during the procedure  Local Anesthetic: Lidocaine 1-2%   Indications: 1. Spondylosis without myelopathy or radiculopathy, lumbosacral region   2. Lumbar facet syndrome (Bilateral) (L>R)   3. Lumbar facet arthropathy (L2-3, L3-4, L4-5) (Bilateral)   4. Chronic low back pain (Primary Area of Pain) (Bilateral) (L>R) w/o sciatica    Pain Score: Pre-procedure: 5 /10 Post-procedure: 0-No pain/10  Pre-op Assessment:  Sabrina Castro is a 57 y.o. (year old), female patient, seen today for interventional treatment. She  has a past surgical history that includes Colonoscopy with propofol (N/A, 01/09/2016); Esophagogastroduodenoscopy (egd) with propofol (N/A, 01/09/2016); and Cholecystectomy open. Sabrina Castro has a current medication list which includes the following prescription(s): amlodipine, duloxetine, ergocalciferol, ferrous sulfate, gabapentin, metoprolol succinate, multivitamin,  olmesartan-hydrochlorothiazide, pantoprazole, trazodone, and tramadol, and the following Facility-Administered Medications: fentanyl and midazolam. Her primarily concern today is the Back Pain (low) and Knee Pain (right)  Initial Vital Signs:  Pulse/HCG Rate: 62ECG Heart Rate: (!) 57 Temp: 98.4 F (36.9 C) Resp: 18 BP: 126/74 SpO2: 97 %  BMI: Estimated body mass index is 59.22 kg/m as calculated from the following:   Height as of this encounter: 5\' 4"  (1.626 m).   Weight as of this encounter: 345 lb (156.5 kg).  Risk Assessment: Allergies: Reviewed. She is allergic to lisinopril.  Allergy Precautions: None required Coagulopathies: Reviewed. None identified.  Blood-thinner therapy: None at this time Active Infection(s): Reviewed. None identified. Sabrina Castro is afebrile  Site Confirmation: Sabrina Castro was asked to confirm the procedure and laterality before marking the site Procedure checklist: Completed Consent: Before the procedure and under the influence of no sedative(s), amnesic(s), or anxiolytics, the patient was informed of the treatment options, risks and possible complications. To fulfill our ethical and legal obligations, as recommended by the American Medical Association's Code of Ethics, I have informed the patient of my clinical impression; the nature and purpose of the treatment or procedure; the risks, benefits, and possible complications of the intervention; the alternatives, including doing nothing; the risk(s) and benefit(s) of the alternative treatment(s) or procedure(s); and the risk(s) and benefit(s) of doing nothing. The patient was provided information about the general risks and possible complications associated with the procedure. These may include, but are not limited to: failure to achieve desired goals, infection, bleeding, organ or nerve damage, allergic reactions, paralysis, and death. In addition, the patient was informed of those risks and complications  associated to Spine-related procedures, such as failure to decrease pain; infection (i.e.: Meningitis, epidural or intraspinal abscess); bleeding (i.e.: epidural hematoma, subarachnoid hemorrhage, or any other type of intraspinal or  peri-dural bleeding); organ or nerve damage (i.e.: Any type of peripheral nerve, nerve root, or spinal cord injury) with subsequent damage to sensory, motor, and/or autonomic systems, resulting in permanent pain, numbness, and/or weakness of one or several areas of the body; allergic reactions; (i.e.: anaphylactic reaction); and/or death. Furthermore, the patient was informed of those risks and complications associated with the medications. These include, but are not limited to: allergic reactions (i.e.: anaphylactic or anaphylactoid reaction(s)); adrenal axis suppression; blood sugar elevation that in diabetics may result in ketoacidosis or comma; water retention that in patients with history of congestive heart failure may result in shortness of breath, pulmonary edema, and decompensation with resultant heart failure; weight gain; swelling or edema; medication-induced neural toxicity; particulate matter embolism and blood vessel occlusion with resultant organ, and/or nervous system infarction; and/or aseptic necrosis of one or more joints. Finally, the patient was informed that Medicine is not an exact science; therefore, there is also the possibility of unforeseen or unpredictable risks and/or possible complications that may result in a catastrophic outcome. The patient indicated having understood very clearly. We have given the patient no guarantees and we have made no promises. Enough time was given to the patient to ask questions, all of which were answered to the patient's satisfaction. Sabrina Castro has indicated that she wanted to continue with the procedure. Attestation: I, the ordering provider, attest that I have discussed with the patient the benefits, risks, side-effects,  alternatives, likelihood of achieving goals, and potential problems during recovery for the procedure that I have provided informed consent. Date  Time: 11/17/2017  8:16 AM  Pre-Procedure Preparation:  Monitoring: As per clinic protocol. Respiration, ETCO2, SpO2, BP, heart rate and rhythm monitor placed and checked for adequate function Safety Precautions: Patient was assessed for positional comfort and pressure points before starting the procedure. Time-out: I initiated and conducted the "Time-out" before starting the procedure, as per protocol. The patient was asked to participate by confirming the accuracy of the "Time Out" information. Verification of the correct person, site, and procedure were performed and confirmed by me, the nursing staff, and the patient. "Time-out" conducted as per Joint Commission's Universal Protocol (UP.01.01.01). Time: 0944  Description of Procedure:          Position: Prone Laterality: Bilateral. The procedure was performed in identical fashion on both sides. Levels:  L2, L3, L4, L5, & S1 Medial Branch Level(s) Area Prepped: Posterior Lumbosacral Region Prepping solution: ChloraPrep (2% chlorhexidine gluconate and 70% isopropyl alcohol) Safety Precautions: Aspiration looking for blood return was conducted prior to all injections. At no point did we inject any substances, as a needle was being advanced. Before injecting, the patient was told to immediately notify me if she was experiencing any new onset of "ringing in the ears, or metallic taste in the mouth". No attempts were made at seeking any paresthesias. Safe injection practices and needle disposal techniques used. Medications properly checked for expiration dates. SDV (single dose vial) medications used. After the completion of the procedure, all disposable equipment used was discarded in the proper designated medical waste containers. Local Anesthesia: Protocol guidelines were followed. The patient was  positioned over the fluoroscopy table. The area was prepped in the usual manner. The time-out was completed. The target area was identified using fluoroscopy. A 12-in long, straight, sterile hemostat was used with fluoroscopic guidance to locate the targets for each level blocked. Once located, the skin was marked with an approved surgical skin marker. Once all sites were marked, the  skin (epidermis, dermis, and hypodermis), as well as deeper tissues (fat, connective tissue and muscle) were infiltrated with a small amount of a short-acting local anesthetic, loaded on a 10cc syringe with a 25G, 1.5-in  Needle. An appropriate amount of time was allowed for local anesthetics to take effect before proceeding to the next step. Local Anesthetic: Lidocaine 2.0% The unused portion of the local anesthetic was discarded in the proper designated containers. Technical explanation of process:  L2 Medial Branch Nerve Block (MBB): The target area for the L2 medial branch is at the junction of the postero-lateral aspect of the superior articular process and the superior, posterior, and medial edge of the transverse process of L3. Under fluoroscopic guidance, a Quincke needle was inserted until contact was made with os over the superior postero-lateral aspect of the pedicular shadow (target area). After negative aspiration for blood, 0.5 mL of the nerve block solution was injected without difficulty or complication. The needle was removed intact. L3 Medial Branch Nerve Block (MBB): The target area for the L3 medial branch is at the junction of the postero-lateral aspect of the superior articular process and the superior, posterior, and medial edge of the transverse process of L4. Under fluoroscopic guidance, a Quincke needle was inserted until contact was made with os over the superior postero-lateral aspect of the pedicular shadow (target area). After negative aspiration for blood, 0.5 mL of the nerve block solution was  injected without difficulty or complication. The needle was removed intact. L4 Medial Branch Nerve Block (MBB): The target area for the L4 medial branch is at the junction of the postero-lateral aspect of the superior articular process and the superior, posterior, and medial edge of the transverse process of L5. Under fluoroscopic guidance, a Quincke needle was inserted until contact was made with os over the superior postero-lateral aspect of the pedicular shadow (target area). After negative aspiration for blood, 0.5 mL of the nerve block solution was injected without difficulty or complication. The needle was removed intact. L5 Medial Branch Nerve Block (MBB): The target area for the L5 medial branch is at the junction of the postero-lateral aspect of the superior articular process and the superior, posterior, and medial edge of the sacral ala. Under fluoroscopic guidance, a Quincke needle was inserted until contact was made with os over the superior postero-lateral aspect of the pedicular shadow (target area). After negative aspiration for blood, 0.5 mL of the nerve block solution was injected without difficulty or complication. The needle was removed intact. S1 Medial Branch Nerve Block (MBB): The target area for the S1 medial branch is at the posterior and inferior 6 o'clock position of the L5-S1 facet joint. Under fluoroscopic guidance, the Quincke needle inserted for the L5 MBB was redirected until contact was made with os over the inferior and postero aspect of the sacrum, at the 6 o' clock position under the L5-S1 facet joint (Target area). After negative aspiration for blood, 0.5 mL of the nerve block solution was injected without difficulty or complication. The needle was removed intact. Procedural Needles: 22-gauge, 3.5-inch, Quincke needles used for all levels. Nerve block solution: 0.2% PF-Ropivacaine + Triamcinolone (40 mg/mL) diluted to a final concentration of 4 mg of Triamcinolone/mL of  Ropivacaine The unused portion of the solution was discarded in the proper designated containers.  Once the entire procedure was completed, the treated area was cleaned, making sure to leave some of the prepping solution back to take advantage of its long term bactericidal properties.  Illustration of the posterior view of the lumbar spine and the posterior neural structures. Laminae of L2 through S1 are labeled. DPRL5, dorsal primary ramus of L5; DPRS1, dorsal primary ramus of S1; DPR3, dorsal primary ramus of L3; FJ, facet (zygapophyseal) joint L3-L4; I, inferior articular process of L4; LB1, lateral branch of dorsal primary ramus of L1; IAB, inferior articular branches from L3 medial branch (supplies L4-L5 facet joint); IBP, intermediate branch plexus; MB3, medial branch of dorsal primary ramus of L3; NR3, third lumbar nerve root; S, superior articular process of L5; SAB, superior articular branches from L4 (supplies L4-5 facet joint also); TP3, transverse process of L3.  Vitals:   11/17/17 0958 11/17/17 1007 11/17/17 1016 11/17/17 1026  BP: 126/77 119/60 120/64 136/74  Pulse:      Resp: (!) 9 18 15  (!) 21  Temp:  98.1 F (36.7 C)  98.1 F (36.7 C)  TempSrc:      SpO2: 95% 97% 95% 100%  Weight:      Height:        Start Time: 0944 hrs. End Time: 0957 hrs.  Imaging Guidance (Spinal):          Type of Imaging Technique: Fluoroscopy Guidance (Spinal) Indication(s): Assistance in needle guidance and placement for procedures requiring needle placement in or near specific anatomical locations not easily accessible without such assistance. Exposure Time: Please see nurses notes. Contrast: None used. Fluoroscopic Guidance: I was personally present during the use of fluoroscopy. "Tunnel Vision Technique" used to obtain the best possible view of the target area. Parallax error corrected before commencing the procedure. "Direction-depth-direction" technique used to introduce the needle under  continuous pulsed fluoroscopy. Once target was reached, antero-posterior, oblique, and lateral fluoroscopic projection used confirm needle placement in all planes. Images permanently stored in EMR. Interpretation: No contrast injected. I personally interpreted the imaging intraoperatively. Adequate needle placement confirmed in multiple planes. Permanent images saved into the patient's record.  Antibiotic Prophylaxis:   Anti-infectives (From admission, onward)   None     Indication(s): None identified  Post-operative Assessment:  Post-procedure Vital Signs:  Pulse/HCG Rate: 6271 Temp: 98.1 F (36.7 C) Resp: (!) 21 BP: 136/74 SpO2: 100 %  EBL: None  Complications: No immediate post-treatment complications observed by team, or reported by patient.  Note: The patient tolerated the entire procedure well. A repeat set of vitals were taken after the procedure and the patient was kept under observation following institutional policy, for this type of procedure. Post-procedural neurological assessment was performed, showing return to baseline, prior to discharge. The patient was provided with post-procedure discharge instructions, including a section on how to identify potential problems. Should any problems arise concerning this procedure, the patient was given instructions to immediately contact us, at any time, without hesitation. In any case, we plan to contact the patient by telephone for a follow-up status report regarding this interventional procedure.  Comments:  No additional relevant information.  Plan of Care    Imaging Orders     DG C-Arm 1-60 Min-No Report  Procedure Orders     LUMBAR FACET(MEDIAL BRANCH NERVE BLOCK) MBNB  Medications ordered for procedure: Meds ordered this encounter  Medications  . lidocaine (XYLOCAINE) 2 % (with pres) injection 400 mg  . midazolam (VERSED) 5 MG/5ML injection 1-2 mg    Make sure Flumazenil is available in the pyxis when using this  medication. If oversedation occurs, administer 0.2 mg IV over 15 sec. If after 45 sec no response, administer 0.2 mg  again over 1 min; may repeat at 1 min intervals; not to exceed 4 doses (1 mg)  . fentaNYL (SUBLIMAZE) injection 25-50 mcg    Make sure Narcan is available in the pyxis when using this medication. In the event of respiratory depression (RR< 8/min): Titrate NARCAN (naloxone) in increments of 0.1 to 0.2 mg IV at 2-3 minute intervals, until desired degree of reversal.  . lactated ringers infusion 1,000 mL  . ropivacaine (PF) 2 mg/mL (0.2%) (NAROPIN) injection 18 mL  . triamcinolone acetonide (KENALOG-40) injection 80 mg  . traMADol (ULTRAM) 50 MG tablet    Sig: Take 1-2 tablets (50-100 mg total) by mouth every 6 (six) hours as needed for severe pain.    Dispense:  240 tablet    Refill:  0    Do not place this medication, or any other prescription from our practice, on "Automatic Refill". Patient may have prescription filled one day early if pharmacy is closed on scheduled refill date. Do not fill until: 12/02/17 To last until: 01/01/18   Medications administered: We administered lidocaine, midazolam, fentaNYL, lactated ringers, ropivacaine (PF) 2 mg/mL (0.2%), and triamcinolone acetonide.  See the medical record for exact dosing, route, and time of administration.  New Prescriptions   No medications on file   Disposition: Discharge home  Discharge Date & Time: 11/17/2017; 1028 hrs.   Physician-requested Follow-up: Return for post-procedure eval (2 wks), w/ Dr. Dossie Arbour.  Future Appointments  Date Time Provider [place]  11/23/2017  2:00 PM [facility] DEXA [facility]  12/07/2017  1:30 PM Milinda Pointer, MD [facility] None   Primary Care Physician: Tracie Harrier, MD Location: [facility] Note by: Gaspar Cola, MD Date: 11/17/2017; Time: 10:56 AM  Disclaimer:  Medicine is not an exact science. The only guarantee in  medicine is that nothing is guaranteed. It is important to note that the decision to proceed with this intervention was based on the information collected from the patient. The Data and conclusions were drawn from the patient's questionnaire, the interview, and the physical examination. Because the information was provided in large part by the patient, it cannot be guaranteed that it has not been purposely or unconsciously manipulated. Every effort has been made to obtain as much relevant data as possible for this evaluation. It is important to note that the conclusions that lead to this procedure are derived in large part from the available data. Always take into account that the treatment will also be dependent on availability of resources and existing treatment guidelines, considered by other Pain Management Practitioners as being common knowledge and practice, at the time of the intervention. For Medico-Legal purposes, it is also important to point out that variation in procedural techniques and pharmacological choices are the acceptable norm. The indications, contraindications, technique, and results of the above procedure should only be interpreted and judged by a Board-Certified Interventional Pain Specialist with extensive familiarity and expertise in the same exact procedure and technique.

## 2017-11-17 NOTE — Progress Notes (Signed)
Safety precautions to be maintained throughout the outpatient stay will include: orient to surroundings, keep bed in low position, maintain call bell within reach at all times, provide assistance with transfer out of bed and ambulation.  

## 2017-11-17 NOTE — Patient Instructions (Signed)

## 2017-11-18 ENCOUNTER — Telehealth: Payer: Self-pay

## 2017-11-18 NOTE — Telephone Encounter (Signed)
Post procedure phone call.  LM 

## 2017-11-23 ENCOUNTER — Ambulatory Visit
Admission: RE | Admit: 2017-11-23 | Discharge: 2017-11-23 | Disposition: A | Discharge status: Home / Self Care | Payer: BLUE CROSS/BLUE SHIELD | Source: Ambulatory Visit | Attending: Internal Medicine | Admitting: Internal Medicine

## 2017-11-23 DIAGNOSIS — Z1231 Encounter for screening mammogram for malignant neoplasm of breast: Secondary | ICD-10-CM | POA: Diagnosis present

## 2017-12-06 NOTE — Progress Notes (Signed)
Patient's Name: Sabrina Castro  MRN: [id number]  Referring Provider: Tracie Harrier, MD  DOB: 08-Oct-1960  PCP: Tracie Harrier, MD  DOS: 12/07/2017  Note by: Gaspar Cola, MD  Service setting: Ambulatory outpatient  Specialty: Interventional Pain Management  Location: [facility] (AMB) Pain Management Facility    Patient type: Established   Primary Reason(s) for Visit: Encounter for post-procedure evaluation of chronic illness with mild to moderate exacerbation CC: Back Pain (lower) and Knee Pain (right )  HPI  Sabrina Castro is a 57 y.o. year old, female patient, who comes today for a post-procedure evaluation. She has Acute maxillary sinusitis, unspecified; Allergic rhinitis; Anemia, unspecified; DDD (degenerative disc disease), lumbar; Depression; Elevated erythrocyte sedimentation rate; Encounter for other general examination; Encounter for screening mammogram for malignant neoplasm of breast; Essential (primary) hypertension; Generalized osteoarthritis of multiple sites; GERD (gastroesophageal reflux disease); History of tachycardia; Morbid obesity with BMI of 50.0-59.9, adult ([place]); Obesity, unspecified; Numbness and tingling of foot; Obstructive sleep apnea; Other fecal abnormalities; Other general symptoms and signs; Other specified anxiety disorders; Pain in right knee; Polyneuropathy; Prediabetes; Tachycardia; Type 2 diabetes mellitus without complication, without long-term current use of insulin ([place]); Vitamin D deficiency, unspecified; Morbid obesity with BMI of 40.0-44.9, adult ([place]); Chronic low back pain (Primary Area of Pain) (Bilateral) (Midline) (L>R) w/ sciatica (Bilateral); Chronic hip pain (Secondary Area of Pain) (Bilateral) (L>R); Chronic lower extremity pain (Tertiary Area of Pain) (Bilateral) (L>R); Chronic knee pain (Fourth Area of Pain) (Bilateral) (R>L); Chronic pain syndrome; Long term current use of opiate analgesic; Pharmacologic therapy; Disorder of skeletal system; Problems  influencing health status; Chronic sacroiliac joint pain; Anterolisthesis (L2-3 and L4-5); Lumbar facet arthropathy (L2-3, L3-4, L4-5) (Bilateral); Lumbar foraminal stenosis (Right: L2-3) (Bilateral: L4-5); Lumbar lateral recess stenosis (Right: L2-3) (Bilateral: L4-5); Abnormal MRI, lumbar spine; Osteoarthritis of hips (Bilateral); Osteoarthritis of knees (Bilateral); Spondylosis without myelopathy or radiculopathy, lumbosacral region; Other specified dorsopathies, sacral and sacrococcygeal region; Elevated C-reactive protein (CRP); Neurogenic pain; Lumbar spine instability; Lumbar facet syndrome (Bilateral) (L>R); and Chronic low back pain (Primary Area of Pain) (Bilateral) (L>R) w/o sciatica on their problem list. Her primarily concern today is the Back Pain (lower) and Knee Pain (right )  Pain Assessment: Location: Lower, Left, Right(right) Back(knee) Radiating: into both legs  Onset: More than a month ago Duration: Chronic pain Quality: Numbness, Tingling, Discomfort, Spasm, Constant Severity: 7 /10 (subjective, self-reported pain score)  Note: Reported level is compatible with observation.                         When using our objective Pain Scale, levels between 6 and 10/10 are said to belong in an emergency room, as it progressively worsens from a 6/10, described as severely limiting, requiring emergency care not usually available at an outpatient pain management facility. At a 6/10 level, communication becomes difficult and requires great effort. Assistance to reach the emergency department may be required. Facial flushing and profuse sweating along with potentially dangerous increases in heart rate and blood pressure will be evident. Effect on ADL: walking any distance is difficult, presents in wheelchair today.  sitting to long will cause numbness and tingling.  Timing: Constant Modifying factors: procedure did help.  medications BP: 131/66  HR: 66  Sabrina Castro comes in today for  post-procedure evaluation after the treatment done on 11/18/2017.  Further details on both, my assessment(s), as well as the proposed treatment plan, please see below.  Post-Procedure Assessment  11/17/2017 Procedure: Diagnostic  bilateral lumbar facet block #1  under fluoroscopic guidance and IV sedation Pre-procedure pain score:  5/10 Post-procedure pain score: 0/10 (100% relief) Influential Factors: BMI: 57 kg/m Intra-procedural challenges: None observed.         Assessment challenges: None detected.              Reported side-effects: None.        Post-procedural adverse reactions or complications: None reported         Sedation: Sedation provided. When no sedatives are used, the analgesic levels obtained are directly associated to the effectiveness of the local anesthetics. However, when sedation is provided, the level of analgesia obtained during the initial 1 hour following the intervention, is believed to be the result of a combination of factors. These factors may include, but are not limited to: 1. The effectiveness of the local anesthetics used. 2. The effects of the analgesic(s) and/or anxiolytic(s) used. 3. The degree of discomfort experienced by the patient at the time of the procedure. 4. The patients ability and reliability in recalling and recording the events. 5. The presence and influence of possible secondary gains and/or psychosocial factors. Reported result: Relief experienced during the 1st hour after the procedure: 100 % (Ultra-Short Term Relief)            Interpretative annotation: Clinically appropriate result. Analgesia during this period is likely to be Local Anesthetic and/or IV Sedative (Analgesic/Anxiolytic) related.          Effects of local anesthetic: The analgesic effects attained during this period are directly associated to the localized infiltration of local anesthetics and therefore cary significant diagnostic value as to the etiological location, or  anatomical origin, of the pain. Expected duration of relief is directly dependent on the pharmacodynamics of the local anesthetic used. Long-acting (4-6 hours) anesthetics used.  Reported result: Relief during the next 4 to 6 hour after the procedure: 100 % (Short-Term Relief)            Interpretative annotation: Clinically appropriate result. Analgesia during this period is likely to be Local Anesthetic-related.          Long-term benefit: Defined as the period of time past the expected duration of local anesthetics (1 hour for short-acting and 4-6 hours for long-acting). With the possible exception of prolonged sympathetic blockade from the local anesthetics, benefits during this period are typically attributed to, or associated with, other factors such as analgesic sensory neuropraxia, antiinflammatory effects, or beneficial biochemical changes provided by agents other than the local anesthetics.  Reported result: Extended relief following procedure: 80 % (Long-Term Relief)            Interpretative annotation: Clinically appropriate result. Good relief. No permanent benefit expected. Inflammation plays a part in the etiology to the pain.          Current benefits: Defined as reported results that persistent at this point in time.   Analgesia: <75 % Sabrina Castro reports improvement of axial symptoms. Function: Sabrina Castro reports improvement in function ROM: Sabrina Castro reports improvement in ROM Interpretative annotation: Ongoing benefit. Therapeutic benefit observed. Effective therapeutic approach.          Interpretation: Results would suggest a successful diagnostic intervention.                  Plan:  Proceed with treatment No.: 2          Laboratory Chemistry  Inflammation Markers (CRP: Acute Phase) (ESR: Chronic Phase) Lab Results  Component  Value Date   CRP 28.4 (H) 09/13/2017   ESRSEDRATE 50 (H) 09/13/2017                         Renal Markers Lab Results  Component Value Date    BUN 8 09/13/2017   CREATININE 0.90 09/13/2017   BCR 9 09/13/2017   GFRAA 83 09/13/2017   GFRNONAA 72 09/13/2017                             Hepatic Markers Lab Results  Component Value Date   AST 14 09/13/2017   ALBUMIN 4.2 09/13/2017                        Neuropathy Markers No results found for: HGBA1C, HIV                      Hematology Parameters Lab Results  Component Value Date   PLT 410 04/28/2017   HGB 11.7 (L) 04/28/2017   HCT 35.9 04/28/2017                        CV Markers Lab Results  Component Value Date   TROPONINI <0.03 04/28/2017                         Note: Lab results reviewed.  Recent Diagnostic Imaging Results  MM 3D SCREEN BREAST BILATERAL CLINICAL DATA:  Screening.  EXAM: DIGITAL SCREENING BILATERAL MAMMOGRAM WITH TOMO AND CAD  COMPARISON:  Previous exam(s).  ACR Breast Density Category b: There are scattered areas of fibroglandular density.  FINDINGS: There are no findings suspicious for malignancy. Images were processed with CAD.  IMPRESSION: No mammographic evidence of malignancy. A result letter of this screening mammogram will be mailed directly to the patient.  RECOMMENDATION: Screening mammogram in one year. (Code:[id number]-B-01Y)  BI-RADS CATEGORY  1: Negative.  Electronically Signed   By: Lillia Mountain M.D.   On: 11/23/2017 16:02  Complexity Note: I personally reviewed the fluoroscopic imaging of the procedure.                        Meds   Current Outpatient Medications:  .  amLODipine (NORVASC) 5 MG tablet, Take 5 mg by mouth daily., Disp: , Rfl:  .  DULoxetine (CYMBALTA) 60 MG capsule, Take 60 mg by mouth daily., Disp: , Rfl:  .  ergocalciferol (VITAMIN D2) 50000 units capsule, Take 50,000 Units by mouth once a week., Disp: , Rfl:  .  ferrous sulfate 325 (65 FE) MG tablet, Take 325 mg by mouth daily with breakfast., Disp: , Rfl:  .  gabapentin (NEURONTIN) 300 MG capsule, Take 1-3 capsules (300-900 mg total) by mouth  4 (four) times daily. Follow the written titration schedule., Disp: 360 capsule, Rfl: 2 .  metFORMIN (GLUCOPHAGE) 500 MG tablet, Take 500 mg by mouth daily., Disp: , Rfl:  .  metoprolol succinate (TOPROL-XL) 50 MG 24 hr tablet, Take 50 mg by mouth daily. Take with or immediately following a meal., Disp: , Rfl:  .  Multiple Vitamin (MULTIVITAMIN) tablet, Take 1 tablet by mouth daily., Disp: , Rfl:  .  olmesartan-hydrochlorothiazide (BENICAR HCT) 20-12.5 MG tablet, Take 1 tablet by mouth daily., Disp: , Rfl:  .  pantoprazole (PROTONIX) 40 MG tablet, Take 40 mg  by mouth daily., Disp: , Rfl:  .  traMADol (ULTRAM) 50 MG tablet, Take 1-2 tablets (50-100 mg total) by mouth every 6 (six) hours as needed for severe pain., Disp: 240 tablet, Rfl: 0 .  traZODone (DESYREL) 50 MG tablet, Take 50 mg by mouth at bedtime., Disp: , Rfl:   ROS  Constitutional: Denies any fever or chills Gastrointestinal: No reported hemesis, hematochezia, vomiting, or acute GI distress Musculoskeletal: Denies any acute onset joint swelling, redness, loss of ROM, or weakness Neurological: No reported episodes of acute onset apraxia, aphasia, dysarthria, agnosia, amnesia, paralysis, loss of coordination, or loss of consciousness  Allergies  Sabrina Castro is allergic to lisinopril.  Sabrina Castro  Drug: Sabrina Castro  reports that she does not use drugs. Alcohol:  reports that she does not drink alcohol. Tobacco:  reports that she has never smoked. She has never used smokeless tobacco. Medical:  has a past medical history of Depression, GERD (gastroesophageal reflux disease), and Hypertension. Surgical: Sabrina Castro  has a past surgical history that includes Colonoscopy with propofol (N/A, 01/09/2016); Esophagogastroduodenoscopy (egd) with propofol (N/A, 01/09/2016); and Cholecystectomy open. Family: family history includes Breast cancer in her maternal aunt.  Constitutional Exam  General appearance: Well nourished, well developed, and well  hydrated. In no apparent acute distress Vitals:   12/07/17 1337  BP: 131/66  Pulse: 66  Resp: 16  Temp: 98.4 F (36.9 C)  TempSrc: Oral  SpO2: 100%  Weight: (!) 336 lb (152.4 kg)  Height: '5\' 4"'  (1.626 m)   BMI Assessment: Estimated body mass index is 57.67 kg/m as calculated from the following:   Height as of this encounter: '5\' 4"'  (1.626 m).   Weight as of this encounter: 336 lb (152.4 kg).  BMI interpretation table: BMI level Category Range association with higher incidence of chronic pain  <18 kg/m2 Underweight   18.5-24.9 kg/m2 Ideal body weight   25-29.9 kg/m2 Overweight Increased incidence by 20%  30-34.9 kg/m2 Obese (Class I) Increased incidence by 68%  35-39.9 kg/m2 Severe obesity (Class II) Increased incidence by 136%  >40 kg/m2 Extreme obesity (Class III) Increased incidence by 254%   Patient's current BMI Ideal Body weight  Body mass index is 57.67 kg/m. Ideal body weight: 54.7 kg (120 lb 9.5 oz) Adjusted ideal body weight: 93.8 kg (206 lb 12.1 oz)   BMI Readings from Last 4 Encounters:  12/07/17 57.67 kg/m  11/17/17 59.22 kg/m  11/02/17 58.88 kg/m  09/13/17 59.22 kg/m   Wt Readings from Last 4 Encounters:  12/07/17 (!) 336 lb (152.4 kg)  11/17/17 (!) 345 lb (156.5 kg)  11/02/17 (!) 343 lb (155.6 kg)  09/13/17 (!) 345 lb (156.5 kg)  Psych/Mental status: Alert, oriented x 3 (person, place, & time)       Eyes: PERLA Respiratory: No evidence of acute respiratory distress  Cervical Spine Area Exam  Skin & Axial Inspection: No masses, redness, edema, swelling, or associated skin lesions Alignment: Symmetrical Functional ROM: Unrestricted ROM      Stability: No instability detected Muscle Tone/Strength: Functionally intact. No obvious neuro-muscular anomalies detected. Sensory (Neurological): Unimpaired Palpation: No palpable anomalies              Upper Extremity (UE) Exam    Side: Right upper extremity  Side: Left upper extremity  Skin & Extremity  Inspection: Skin color, temperature, and hair growth are WNL. No peripheral edema or cyanosis. No masses, redness, swelling, asymmetry, or associated skin lesions. No contractures.  Skin & Extremity Inspection: Skin  color, temperature, and hair growth are WNL. No peripheral edema or cyanosis. No masses, redness, swelling, asymmetry, or associated skin lesions. No contractures.  Functional ROM: Unrestricted ROM          Functional ROM: Unrestricted ROM          Muscle Tone/Strength: Functionally intact. No obvious neuro-muscular anomalies detected.  Muscle Tone/Strength: Functionally intact. No obvious neuro-muscular anomalies detected.  Sensory (Neurological): Unimpaired          Sensory (Neurological): Unimpaired          Palpation: No palpable anomalies              Palpation: No palpable anomalies              Provocative Test(s):  Phalen's test: deferred Tinel's test: deferred Apley's scratch test (touch opposite shoulder):  Action 1 (Across chest): deferred Action 2 (Overhead): deferred Action 3 (LB reach): deferred   Provocative Test(s):  Phalen's test: deferred Tinel's test: deferred Apley's scratch test (touch opposite shoulder):  Action 1 (Across chest): deferred Action 2 (Overhead): deferred Action 3 (LB reach): deferred    Thoracic Spine Area Exam  Skin & Axial Inspection: No masses, redness, or swelling Alignment: Symmetrical Functional ROM: Unrestricted ROM Stability: No instability detected Muscle Tone/Strength: Functionally intact. No obvious neuro-muscular anomalies detected. Sensory (Neurological): Unimpaired Muscle strength & Tone: No palpable anomalies  Lumbar Spine Area Exam  Skin & Axial Inspection: No masses, redness, or swelling Alignment: Symmetrical Functional ROM: Improved after treatment       Stability: No instability detected Muscle Tone/Strength: Increased muscle tone over affected area Sensory (Neurological): Movement-associated pain Palpation:  Complains of area being tender to palpation       Provocative Tests: Lumbar Hyperextension/rotation test: (+) bilaterally for facet joint pain. Lumbar quadrant test (Kemp's test): (+) bilaterally for facet joint pain. Lumbar Lateral bending test: deferred today       Patrick's Maneuver: deferred today                   FABER test: deferred today                   Thigh-thrust test: deferred today       S-I compression test: deferred today       S-I distraction test: deferred today        Gait & Posture Assessment  Ambulation: Unassisted Gait: Relatively normal for age and body habitus Posture: WNL   Lower Extremity Exam    Side: Right lower extremity  Side: Left lower extremity  Stability: No instability observed          Stability: No instability observed          Skin & Extremity Inspection: Skin color, temperature, and hair growth are WNL. No peripheral edema or cyanosis. No masses, redness, swelling, asymmetry, or associated skin lesions. No contractures.  Skin & Extremity Inspection: Skin color, temperature, and hair growth are WNL. No peripheral edema or cyanosis. No masses, redness, swelling, asymmetry, or associated skin lesions. No contractures.  Functional ROM: Unrestricted ROM                  Functional ROM: Unrestricted ROM                  Muscle Tone/Strength: Functionally intact. No obvious neuro-muscular anomalies detected.  Muscle Tone/Strength: Functionally intact. No obvious neuro-muscular anomalies detected.  Sensory (Neurological): Unimpaired  Sensory (Neurological): Unimpaired  Palpation: No palpable anomalies  Palpation: No palpable anomalies   Assessment  Primary Diagnosis & Pertinent Problem List: The primary encounter diagnosis was Spondylosis without myelopathy or radiculopathy, lumbosacral region. Diagnoses of Lumbar facet syndrome (Bilateral) (L>R), Lumbar facet arthropathy (L2-3, L3-4, L4-5) (Bilateral), and Chronic low back pain (Primary Area of Pain)  (Bilateral) (L>R) w/o sciatica were also pertinent to this visit.  Status Diagnosis  Persistent Improving Stable 1. Spondylosis without myelopathy or radiculopathy, lumbosacral region   2. Lumbar facet syndrome (Bilateral) (L>R)   3. Lumbar facet arthropathy (L2-3, L3-4, L4-5) (Bilateral)   4. Chronic low back pain (Primary Area of Pain) (Bilateral) (L>R) w/o sciatica     Problems updated and reviewed during this visit: No problems updated. Plan of Care  Pharmacotherapy (Medications Ordered): No orders of the defined types were placed in this encounter.  Medications administered today: Sabrina Castro had no medications administered during this visit.   Procedure Orders     LUMBAR FACET(MEDIAL BRANCH NERVE BLOCK) [facility]     LUMBAR FACET(MEDIAL BRANCH NERVE BLOCK) [facility] Lab Orders  No laboratory test(s) ordered today   Imaging Orders  No imaging studies ordered today   Referral Orders  No referral(s) requested today    Interventional management options: Planned, scheduled, and/or pending:   Diagnostic bilateral lumbar facet block #2  under fluoroscopic guidance and IV sedation    Considering:   Diagnostic bilateral lumbar facet block  Possible bilateral lumbar facet RFA  Diagnostic bilateral sacroiliac joint block  Possible bilateral sacroiliac joint RFA  Diagnostic L2-3 LESI  Diagnostic L3-4 LESI  Diagnostic L4-5 LESI  Diagnostic right-sided L2-3 transforaminal ESI  Diagnostic bilateral L4-5 transforaminal ESI  Diagnostic bilateral intra-articular hip joint injection  Diagnostic bilateral femoral nerve + obturator nerve block  Possible bilateral femoral nerve + obturator nerve RFA  Diagnostic bilateral intra-articular knee injection with local anesthetic and steroid  Possible bilateral, series of 5, intra-articular Hyalgan knee injections  Diagnostic bilateral genicular nerve block  Possible bilateral genicular nerve RFA    Palliative PRN treatment(s):   Palliative  bilateral lumbar facet block    Provider-requested follow-up: Return for Procedure (w/ sedation): (B) L-FCT BLK #2.  Future Appointments  Date Time Provider [place]  01/05/2018  9:15 AM Milinda Pointer, MD [facility] None   Primary Care Physician: Tracie Harrier, MD Location: [facility] Outpatient Pain Management Facility Note by: Gaspar Cola, MD Date: 12/07/2017; Time: 2:22 PM

## 2017-12-07 ENCOUNTER — Ambulatory Visit: Payer: BLUE CROSS/BLUE SHIELD | Attending: Pain Medicine | Admitting: Pain Medicine

## 2017-12-07 ENCOUNTER — Encounter: Payer: Self-pay | Admitting: Pain Medicine

## 2017-12-07 VITALS — BP 131/66 | HR 66 | Temp 98.4°F | Resp 16 | Ht 64.0 in | Wt 336.0 lb

## 2017-12-07 DIAGNOSIS — Z888 Allergy status to other drugs, medicaments and biological substances status: Secondary | ICD-10-CM | POA: Insufficient documentation

## 2017-12-07 DIAGNOSIS — D649 Anemia, unspecified: Secondary | ICD-10-CM | POA: Diagnosis not present

## 2017-12-07 DIAGNOSIS — E119 Type 2 diabetes mellitus without complications: Secondary | ICD-10-CM | POA: Diagnosis not present

## 2017-12-07 DIAGNOSIS — Z79891 Long term (current) use of opiate analgesic: Secondary | ICD-10-CM | POA: Diagnosis not present

## 2017-12-07 DIAGNOSIS — I10 Essential (primary) hypertension: Secondary | ICD-10-CM | POA: Diagnosis not present

## 2017-12-07 DIAGNOSIS — R200 Anesthesia of skin: Secondary | ICD-10-CM | POA: Diagnosis not present

## 2017-12-07 DIAGNOSIS — Z9889 Other specified postprocedural states: Secondary | ICD-10-CM | POA: Insufficient documentation

## 2017-12-07 DIAGNOSIS — M47817 Spondylosis without myelopathy or radiculopathy, lumbosacral region: Secondary | ICD-10-CM | POA: Diagnosis not present

## 2017-12-07 DIAGNOSIS — G8929 Other chronic pain: Secondary | ICD-10-CM

## 2017-12-07 DIAGNOSIS — M25561 Pain in right knee: Secondary | ICD-10-CM | POA: Diagnosis not present

## 2017-12-07 DIAGNOSIS — M48061 Spinal stenosis, lumbar region without neurogenic claudication: Secondary | ICD-10-CM | POA: Diagnosis not present

## 2017-12-07 DIAGNOSIS — F329 Major depressive disorder, single episode, unspecified: Secondary | ICD-10-CM | POA: Insufficient documentation

## 2017-12-07 DIAGNOSIS — Z79899 Other long term (current) drug therapy: Secondary | ICD-10-CM | POA: Diagnosis not present

## 2017-12-07 DIAGNOSIS — F419 Anxiety disorder, unspecified: Secondary | ICD-10-CM | POA: Insufficient documentation

## 2017-12-07 DIAGNOSIS — J309 Allergic rhinitis, unspecified: Secondary | ICD-10-CM | POA: Insufficient documentation

## 2017-12-07 DIAGNOSIS — Z7984 Long term (current) use of oral hypoglycemic drugs: Secondary | ICD-10-CM | POA: Insufficient documentation

## 2017-12-07 DIAGNOSIS — M47819 Spondylosis without myelopathy or radiculopathy, site unspecified: Secondary | ICD-10-CM | POA: Diagnosis not present

## 2017-12-07 DIAGNOSIS — Z9049 Acquired absence of other specified parts of digestive tract: Secondary | ICD-10-CM | POA: Insufficient documentation

## 2017-12-07 DIAGNOSIS — G4733 Obstructive sleep apnea (adult) (pediatric): Secondary | ICD-10-CM | POA: Insufficient documentation

## 2017-12-07 DIAGNOSIS — M545 Low back pain: Secondary | ICD-10-CM | POA: Diagnosis present

## 2017-12-07 DIAGNOSIS — M47816 Spondylosis without myelopathy or radiculopathy, lumbar region: Secondary | ICD-10-CM

## 2017-12-07 DIAGNOSIS — Z6841 Body Mass Index (BMI) 40.0 and over, adult: Secondary | ICD-10-CM | POA: Insufficient documentation

## 2017-12-07 DIAGNOSIS — K219 Gastro-esophageal reflux disease without esophagitis: Secondary | ICD-10-CM | POA: Diagnosis not present

## 2017-12-07 DIAGNOSIS — M5136 Other intervertebral disc degeneration, lumbar region: Secondary | ICD-10-CM | POA: Diagnosis not present

## 2017-12-07 NOTE — Patient Instructions (Addendum)
____________________________________________________________________________________________  Preparing for Procedure with Sedation  Instructions: . Oral Intake: Do not eat or drink anything for at least 8 hours prior to your procedure. . Transportation: Public transportation is not allowed. Bring an adult driver. The driver must be physically present in our waiting room before any procedure can be started. Marland Kitchen Physical Assistance: Bring an adult physically capable of assisting you, in the event you need help. This adult should keep you company at home for at least 6 hours after the procedure. . Blood Pressure Medicine: Take your blood pressure medicine with a sip of water the morning of the procedure. . Blood thinners:  . Diabetics on insulin: Notify the staff so that you can be scheduled 1st case in the morning. If your diabetes requires high dose insulin, take only  of your normal insulin dose the morning of the procedure and notify the staff that you have done so. . Preventing infections: Shower with an antibacterial soap the morning of your procedure. . Build-up your immune system: Take 1000 mg of Vitamin C with every meal (3 times a day) the day prior to your procedure. Marland Kitchen Antibiotics: Inform the staff if you have a condition or reason that requires you to take antibiotics before dental procedures. . Pregnancy: If you are pregnant, call and cancel the procedure. . Sickness: If you have a cold, fever, or any active infections, call and cancel the procedure. . Arrival: You must be in the facility at least 30 minutes prior to your scheduled procedure. . Children: Do not bring children with you. . Dress appropriately: Bring dark clothing that you would not mind if they get stained. . Valuables: Do not bring any jewelry or valuables.  Procedure appointments are reserved for interventional treatments only. Marland Kitchen No Prescription Refills. . No medication changes will be discussed during procedure  appointments. . No disability issues will be discussed.  Remember:  Regular Business hours are:  Monday to Thursday 8:00 AM to 4:00 PM  Provider's Schedule: Milinda Pointer, MD:  Procedure days: Tuesday and Thursday 7:30 AM to 4:00 PM  Gillis Santa, MD:  Procedure days: Monday and Wednesday 7:30 AM to 4:00 PM ____________________________________________________________________________________________   ____________________________________________________________________________________________  Pain Scale  Introduction: The pain score used by this practice is the Verbal Numerical Rating Scale (VNRS-11). This is an 11-point scale. It is for adults and children 10 years or older. There are significant differences in how the pain score is reported, used, and applied. Forget everything you learned in the past and learn this scoring system.  General Information: The scale should reflect your current level of pain. Unless you are specifically asked for the level of your worst pain, or your average pain. If you are asked for one of these two, then it should be understood that it is over the past 24 hours.  Basic Activities of Daily Living (ADL): Personal hygiene, dressing, eating, transferring, and using restroom.  Instructions: Most patients tend to report their level of pain as a combination of two factors, their physical pain and their psychosocial pain. This last one is also known as "suffering" and it is reflection of how physical pain affects you socially and psychologically. From now on, report them separately. From this point on, when asked to report your pain level, report only your physical pain. Use the following table for reference.  Pain Clinic Pain Levels (0-5/10)  Pain Level Score  Description  No Pain 0   Mild pain 1 Nagging, annoying, but does not interfere  with basic activities of daily living (ADL). Patients are able to eat, bathe, get dressed, toileting (being able to  get on and off the toilet and perform personal hygiene functions), transfer (move in and out of bed or a chair without assistance), and maintain continence (able to control bladder and bowel functions). Blood pressure and heart rate are unaffected. A normal heart rate for a healthy adult ranges from 60 to 100 bpm (beats per minute).   Mild to moderate pain 2 Noticeable and distracting. Impossible to hide from other people. More frequent flare-ups. Still possible to adapt and function close to normal. It can be very annoying and may have occasional stronger flare-ups. With discipline, patients may get used to it and adapt.   Moderate pain 3 Interferes significantly with activities of daily living (ADL). It becomes difficult to feed, bathe, get dressed, get on and off the toilet or to perform personal hygiene functions. Difficult to get in and out of bed or a chair without assistance. Very distracting. With effort, it can be ignored when deeply involved in activities.   Moderately severe pain 4 Impossible to ignore for more than a few minutes. With effort, patients may still be able to manage work or participate in some social activities. Very difficult to concentrate. Signs of autonomic nervous system discharge are evident: dilated pupils (mydriasis); mild sweating (diaphoresis); sleep interference. Heart rate becomes elevated (>115 bpm). Diastolic blood pressure (lower number) rises above 100 mmHg. Patients find relief in laying down and not moving.   Severe pain 5 Intense and extremely unpleasant. Associated with frowning face and frequent crying. Pain overwhelms the senses.  Ability to do any activity or maintain social relationships becomes significantly limited. Conversation becomes difficult. Pacing back and forth is common, as getting into a comfortable position is nearly impossible. Pain wakes you up from deep sleep. Physical signs will be obvious: pupillary dilation; increased sweating; goosebumps;  brisk reflexes; cold, clammy hands and feet; nausea, vomiting or dry heaves; loss of appetite; significant sleep disturbance with inability to fall asleep or to remain asleep. When persistent, significant weight loss is observed due to the complete loss of appetite and sleep deprivation.  Blood pressure and heart rate becomes significantly elevated. Caution: If elevated blood pressure triggers a pounding headache associated with blurred vision, then the patient should immediately seek attention at an urgent or emergency care unit, as these may be signs of an impending stroke.    Emergency Department Pain Levels (6-10/10)  Emergency Room Pain 6 Severely limiting. Requires emergency care and should not be seen or managed at an outpatient pain management facility. Communication becomes difficult and requires great effort. Assistance to reach the emergency department may be required. Facial flushing and profuse sweating along with potentially dangerous increases in heart rate and blood pressure will be evident.   Distressing pain 7 Self-care is very difficult. Assistance is required to transport, or use restroom. Assistance to reach the emergency department will be required. Tasks requiring coordination, such as bathing and getting dressed become very difficult.   Disabling pain 8 Self-care is no longer possible. At this level, pain is disabling. The individual is unable to do even the most "basic" activities such as walking, eating, bathing, dressing, transferring to a bed, or toileting. Fine motor skills are lost. It is difficult to think clearly.   Incapacitating pain 9 Pain becomes incapacitating. Thought processing is no longer possible. Difficult to remember your own name. Control of movement and coordination are lost.  The worst pain imaginable 10 At this level, most patients pass out from pain. When this level is reached, collapse of the autonomic nervous system occurs, leading to a sudden drop in  blood pressure and heart rate. This in turn results in a temporary and dramatic drop in blood flow to the brain, leading to a loss of consciousness. Fainting is one of the body's self defense mechanisms. Passing out puts the brain in a calmed state and causes it to shut down for a while, in order to begin the healing process.    Summary: 1. Refer to this scale when providing [place] with your pain level. 2. Be accurate and careful when reporting your pain level. This will help with your care. 3. Over-reporting your pain level will lead to loss of credibility. 4. Even a level of 1/10 means that there is pain and will be treated at our facility. 5. High, inaccurate reporting will be documented as "Symptom Exaggeration", leading to loss of credibility and suspicions of possible secondary gains such as obtaining more narcotics, or wanting to appear disabled, for fraudulent reasons. 6. Only pain levels of 5 or below will be seen at our facility. 7. Pain levels of 6 and above will be sent to the Emergency Department and the appointment cancelled. ____________________________________________________________________________________________   ____________________________________________________________________________________________  Muscle Spasms & Cramps  Cause:  The most common cause of muscle spasms and cramps is vitamin and/or electrolyte (calcium, potassium, sodium, etc.) deficiencies.  Possible triggers: Sweating - causes loss of electrolytes thru the skin. Steroids - causes loss of electrolytes thru the urine.  Treatment: 1. Gatorade (or any other electrolyte-replenishing drink) - Take 1, 8 oz glass with each meal (3 times a day). 2. OTC (over-the-counter) Magnesium 400 to 500 mg - Take 1 tablet twice a day (one with breakfast and one before bedtime). If you have kidney problems, talk to your primary care physician before taking any Magnesium. 3. Tonic Water with quinine - Take 1, 8 oz glass  before bedtime.   ____________________________________________________________________________________________   ____________________________________________________________________________________________  General Risks and Possible Complications  Patient Responsibilities: It is important that you read this as it is part of your informed consent. It is our duty to inform you of the risks and possible complications associated with treatments offered to you. It is your responsibility as a patient to read this and to ask questions about anything that is not clear or that you believe was not covered in this document.  Patient's Rights: You have the right to refuse treatment. You also have the right to change your mind, even after initially having agreed to have the treatment done. However, under this last option, if you wait until the last second to change your mind, you may be charged for the materials used up to that point.  Introduction: Medicine is not an Chief Strategy Officer. Everything in Medicine, including the lack of treatment(s), carries the potential for danger, harm, or loss (which is by definition: Risk). In Medicine, a complication is a secondary problem, condition, or disease that can aggravate an already existing one. All treatments carry the risk of possible complications. The fact that a side effects or complications occurs, does not imply that the treatment was conducted incorrectly. It must be clearly understood that these can happen even when everything is done following the highest safety standards.  No treatment: You can choose not to proceed with the proposed treatment alternative. The "PRO(s)" would include: avoiding the risk of complications associated with the therapy.  The "CON(s)" would include: not getting any of the treatment benefits. These benefits fall under one of three categories: diagnostic; therapeutic; and/or palliative. Diagnostic benefits include: getting information  which can ultimately lead to improvement of the disease or symptom(s). Therapeutic benefits are those associated with the successful treatment of the disease. Finally, palliative benefits are those related to the decrease of the primary symptoms, without necessarily curing the condition (example: decreasing the pain from a flare-up of a chronic condition, such as incurable terminal cancer).  General Risks and Complications: These are associated to most interventional treatments. They can occur alone, or in combination. They fall under one of the following six (6) categories: no benefit or worsening of symptoms; bleeding; infection; nerve damage; allergic reactions; and/or death. 1. No benefits or worsening of symptoms: In Medicine there are no guarantees, only probabilities. No healthcare provider can ever guarantee that a medical treatment will work, they can only state the probability that it may. Furthermore, there is always the possibility that the condition may worsen, either directly, or indirectly, as a consequence of the treatment. 2. Bleeding: This is more common if the patient is taking a blood thinner, either prescription or over the counter (example: Goody Powders, Fish oil, Aspirin, Garlic, etc.), or if suffering a condition associated with impaired coagulation (example: Hemophilia, cirrhosis of the liver, low platelet counts, etc.). However, even if you do not have one on these, it can still happen. If you have any of these conditions, or take one of these drugs, make sure to notify your treating physician. 3. Infection: This is more common in patients with a compromised immune system, either due to disease (example: diabetes, cancer, human immunodeficiency virus [HIV], etc.), or due to medications or treatments (example: therapies used to treat cancer and rheumatological diseases). However, even if you do not have one on these, it can still happen. If you have any of these conditions, or take one  of these drugs, make sure to notify your treating physician. 4. Nerve Damage: This is more common when the treatment is an invasive one, but it can also happen with the use of medications, such as those used in the treatment of cancer. The damage can occur to small secondary nerves, or to large primary ones, such as those in the spinal cord and brain. This damage may be temporary or permanent and it may lead to impairments that can range from temporary numbness to permanent paralysis and/or brain death. 5. Allergic Reactions: Any time a substance or material comes in contact with our body, there is the possibility of an allergic reaction. These can range from a mild skin rash (contact dermatitis) to a severe systemic reaction (anaphylactic reaction), which can result in death. 6. Death: In general, any medical intervention can result in death, most of the time due to an unforeseen complication. ____________________________________________________________________________________________  Facet Blocks Patient Information  Description: The facets are joints in the spine between the vertebrae.  Like any joints in the body, facets can become irritated and painful.  Arthritis can also effect the facets.  By injecting steroids and local anesthetic in and around these joints, we can temporarily block the nerve supply to them.  Steroids act directly on irritated nerves and tissues to reduce selling and inflammation which often leads to decreased pain.  Facet blocks may be done anywhere along the spine from the neck to the low back depending upon the location of your pain.   After numbing the skin with local anesthetic (like  Novocaine), a small needle is passed onto the facet joints under x-ray guidance.  You may experience a sensation of pressure while this is being done.  The entire block usually lasts about 15-25 minutes.   Conditions which may be treated by facet blocks:   Low back/buttock  pain  Neck/shoulder pain  Certain types of headaches  Preparation for the injection:  1. Do not eat any solid food or dairy products within 8 hours of your appointment. 2. You may drink clear liquid up to 3 hours before appointment.  Clear liquids include water, black coffee, juice or soda.  No milk or cream please. 3. You may take your regular medication, including pain medications, with a sip of water before your appointment.  Diabetics should hold regular insulin (if taken separately) and take 1/2 normal NPH dose the morning of the procedure.  Carry some sugar containing items with you to your appointment. 4. A driver must accompany you and be prepared to drive you home after your procedure. 5. Bring all your current medications with you. 6. An IV may be inserted and sedation may be given at the discretion of the physician. 7. A blood pressure cuff, EKG and other monitors will often be applied during the procedure.  Some patients may need to have extra oxygen administered for a short period. 8. You will be asked to provide medical information, including your allergies and medications, prior to the procedure.  We must know immediately if you are taking blood thinners (like Coumadin/Warfarin) or if you are allergic to IV iodine contrast (dye).  We must know if you could possible be pregnant.  Possible side-effects:   Bleeding from needle site  Infection (rare, may require surgery)  Nerve injury (rare)  Numbness & tingling (temporary)  Difficulty urinating (rare, temporary)  Spinal headache (a headache worse with upright posture)  Light-headedness (temporary)  Pain at injection site (serveral days)  Decreased blood pressure (rare, temporary)  Weakness in arm/leg (temporary)  Pressure sensation in back/neck (temporary)   Call if you experience:   Fever/chills associated with headache or increased back/neck pain  Headache worsened by an upright position  New onset,  weakness or numbness of an extremity below the injection site  Hives or difficulty breathing (go to the emergency room)  Inflammation or drainage at the injection site(s)  Severe back/neck pain greater than usual  New symptoms which are concerning to you  Please note:  Although the local anesthetic injected can often make your back or neck feel good for several hours after the injection, the pain will likely return. It takes 3-7 days for steroids to work.  You may not notice any pain relief for at least one week.  If effective, we will often do a series of 2-3 injections spaced 3-6 weeks apart to maximally decrease your pain.  After the initial series, you may be a candidate for a more permanent nerve block of the facets.  If you have any questions, please call #[phone]) [facility]

## 2017-12-22 ENCOUNTER — Telehealth: Payer: Self-pay | Admitting: Pain Medicine

## 2017-12-22 NOTE — Telephone Encounter (Signed)
Patient lvmail stating she is having increased pain in back like a catch and it hurts when she moves or tries to stand up. Please call to discuss what she can do until procedure on 01-05-18

## 2017-12-27 ENCOUNTER — Telehealth: Payer: Self-pay | Admitting: Pain Medicine

## 2017-12-27 NOTE — Telephone Encounter (Signed)
Attempted to reach patient, voicemail left.  

## 2017-12-27 NOTE — Telephone Encounter (Signed)
Patient called asking what she can do for back pain until her procedure on 01-05-18. Please call patient

## 2017-12-28 NOTE — Telephone Encounter (Signed)
Patient is scheduled Tue 01-03-18 at 9am for eval of pain and tingling in arms.

## 2017-12-28 NOTE — Telephone Encounter (Signed)
Having pain in neck, with pain and tingling down arms. Would like to have something done for this instead of BLF scheduled later this month. Advised her that she will need to see provider for eval first so an exam can be done and the appropriate procedure can be ordered.  Please schedule patient with [facility] or Dr. Dossie Arbour for eval.

## 2018-01-03 ENCOUNTER — Other Ambulatory Visit: Payer: Self-pay

## 2018-01-03 ENCOUNTER — Ambulatory Visit: Payer: BLUE CROSS/BLUE SHIELD | Attending: Nurse Practitioner | Admitting: Nurse Practitioner

## 2018-01-03 ENCOUNTER — Ambulatory Visit
Admission: RE | Admit: 2018-01-03 | Discharge: 2018-01-03 | Disposition: A | Discharge status: Home / Self Care | Payer: BLUE CROSS/BLUE SHIELD | Source: Ambulatory Visit | Attending: Nurse Practitioner | Admitting: Nurse Practitioner

## 2018-01-03 ENCOUNTER — Encounter: Payer: Self-pay | Admitting: Nurse Practitioner

## 2018-01-03 VITALS — BP 136/75 | HR 73 | Temp 98.0°F | Resp 18 | Ht 64.0 in | Wt 336.0 lb

## 2018-01-03 DIAGNOSIS — M7918 Myalgia, other site: Secondary | ICD-10-CM

## 2018-01-03 DIAGNOSIS — I10 Essential (primary) hypertension: Secondary | ICD-10-CM | POA: Insufficient documentation

## 2018-01-03 DIAGNOSIS — M25551 Pain in right hip: Secondary | ICD-10-CM | POA: Insufficient documentation

## 2018-01-03 DIAGNOSIS — E559 Vitamin D deficiency, unspecified: Secondary | ICD-10-CM | POA: Insufficient documentation

## 2018-01-03 DIAGNOSIS — Z7984 Long term (current) use of oral hypoglycemic drugs: Secondary | ICD-10-CM | POA: Diagnosis not present

## 2018-01-03 DIAGNOSIS — M542 Cervicalgia: Secondary | ICD-10-CM | POA: Insufficient documentation

## 2018-01-03 DIAGNOSIS — M25511 Pain in right shoulder: Secondary | ICD-10-CM

## 2018-01-03 DIAGNOSIS — E1142 Type 2 diabetes mellitus with diabetic polyneuropathy: Secondary | ICD-10-CM | POA: Insufficient documentation

## 2018-01-03 DIAGNOSIS — Z79899 Other long term (current) drug therapy: Secondary | ICD-10-CM | POA: Diagnosis not present

## 2018-01-03 DIAGNOSIS — D649 Anemia, unspecified: Secondary | ICD-10-CM | POA: Diagnosis not present

## 2018-01-03 DIAGNOSIS — R700 Elevated erythrocyte sedimentation rate: Secondary | ICD-10-CM | POA: Insufficient documentation

## 2018-01-03 DIAGNOSIS — M25552 Pain in left hip: Secondary | ICD-10-CM | POA: Insufficient documentation

## 2018-01-03 DIAGNOSIS — G894 Chronic pain syndrome: Secondary | ICD-10-CM

## 2018-01-03 DIAGNOSIS — M170 Bilateral primary osteoarthritis of knee: Secondary | ICD-10-CM | POA: Diagnosis not present

## 2018-01-03 DIAGNOSIS — K219 Gastro-esophageal reflux disease without esophagitis: Secondary | ICD-10-CM | POA: Diagnosis not present

## 2018-01-03 DIAGNOSIS — M533 Sacrococcygeal disorders, not elsewhere classified: Secondary | ICD-10-CM | POA: Insufficient documentation

## 2018-01-03 DIAGNOSIS — M48061 Spinal stenosis, lumbar region without neurogenic claudication: Secondary | ICD-10-CM | POA: Insufficient documentation

## 2018-01-03 DIAGNOSIS — Z9049 Acquired absence of other specified parts of digestive tract: Secondary | ICD-10-CM | POA: Diagnosis not present

## 2018-01-03 DIAGNOSIS — M5388 Other specified dorsopathies, sacral and sacrococcygeal region: Secondary | ICD-10-CM | POA: Insufficient documentation

## 2018-01-03 DIAGNOSIS — Z888 Allergy status to other drugs, medicaments and biological substances status: Secondary | ICD-10-CM | POA: Insufficient documentation

## 2018-01-03 DIAGNOSIS — Z6841 Body Mass Index (BMI) 40.0 and over, adult: Secondary | ICD-10-CM | POA: Diagnosis not present

## 2018-01-03 DIAGNOSIS — M479 Spondylosis, unspecified: Secondary | ICD-10-CM | POA: Diagnosis not present

## 2018-01-03 DIAGNOSIS — G4733 Obstructive sleep apnea (adult) (pediatric): Secondary | ICD-10-CM | POA: Insufficient documentation

## 2018-01-03 DIAGNOSIS — F329 Major depressive disorder, single episode, unspecified: Secondary | ICD-10-CM | POA: Diagnosis not present

## 2018-01-03 DIAGNOSIS — F419 Anxiety disorder, unspecified: Secondary | ICD-10-CM | POA: Diagnosis not present

## 2018-01-03 DIAGNOSIS — R7982 Elevated C-reactive protein (CRP): Secondary | ICD-10-CM | POA: Insufficient documentation

## 2018-01-03 DIAGNOSIS — Z79891 Long term (current) use of opiate analgesic: Secondary | ICD-10-CM | POA: Insufficient documentation

## 2018-01-03 MED ORDER — ORPHENADRINE CITRATE 30 MG/ML IJ SOLN
60.0000 mg | Freq: Once | INTRAMUSCULAR | Status: AC
  Administered 2018-01-03: 60 mg via INTRAMUSCULAR
  Filled 2018-01-03: qty 2

## 2018-01-03 MED ORDER — KETOROLAC TROMETHAMINE 60 MG/2ML IM SOLN
60.0000 mg | Freq: Once | INTRAMUSCULAR | Status: AC
  Administered 2018-01-03: 60 mg via INTRAMUSCULAR
  Filled 2018-01-03: qty 2

## 2018-01-03 NOTE — Progress Notes (Signed)
Patient's Name: Sabrina Castro  MRN: [id number]  Referring Provider: Tracie Harrier, MD  DOB: [birth year]  PCP: Sabrina Harrier, MD  DOS: 01/03/2018  Note by: Sabrina Francois NP  Service setting: Ambulatory outpatient  Specialty: Interventional Pain Management  Location: [facility] (AMB) Pain Management Facility    Patient type: Established    Primary Reason(s) for Visit: Evaluation of chronic illnesses with exacerbation, or progression (Level of risk: moderate) CC: Neck Pain (right)  HPI  Sabrina Castro is a 57 y.o. year old, female patient, who comes today for a follow-up evaluation. She has Acute maxillary sinusitis, unspecified; Allergic rhinitis; Anemia, unspecified; DDD (degenerative disc disease), lumbar; Depression; Elevated erythrocyte sedimentation rate; Encounter for other general examination; Encounter for screening mammogram for malignant neoplasm of breast; Essential (primary) hypertension; Generalized osteoarthritis of multiple sites; GERD (gastroesophageal reflux disease); History of tachycardia; Morbid obesity with BMI of 50.0-59.9, adult ([place]); Obesity, unspecified; Numbness and tingling of foot; Obstructive sleep apnea; Other fecal abnormalities; Other general symptoms and signs; Other specified anxiety disorders; Pain in right knee; Polyneuropathy; Prediabetes; Tachycardia; Type 2 diabetes mellitus without complication, without long-term current use of insulin ([place]); Vitamin D deficiency, unspecified; Morbid obesity with BMI of 40.0-44.9, adult ([place]); Chronic low back pain (Primary Area of Pain) (Bilateral) (Midline) (L>R) w/ sciatica (Bilateral); Chronic hip pain (Secondary Area of Pain) (Bilateral) (L>R); Chronic lower extremity pain (Tertiary Area of Pain) (Bilateral) (L>R); Chronic knee pain (Fourth Area of Pain) (Bilateral) (R>L); Chronic pain syndrome; Long term current use of opiate analgesic; Pharmacologic therapy; Disorder of skeletal system; Problems influencing health status; Chronic  sacroiliac joint pain; Anterolisthesis (L2-3 and L4-5); Lumbar facet arthropathy (L2-3, L3-4, L4-5) (Bilateral); Lumbar foraminal stenosis (Right: L2-3) (Bilateral: L4-5); Lumbar lateral recess stenosis (Right: L2-3) (Bilateral: L4-5); Abnormal MRI, lumbar spine; Osteoarthritis of hips (Bilateral); Osteoarthritis of knees (Bilateral); Spondylosis without myelopathy or radiculopathy, lumbosacral region; Other specified dorsopathies, sacral and sacrococcygeal region; Elevated C-reactive protein (CRP); Neurogenic pain; Lumbar spine instability; Lumbar facet syndrome (Bilateral) (L>R); Chronic low back pain (Primary Area of Pain) (Bilateral) (L>R) w/o sciatica; Neck pain; and Myofascial pain on their problem list. Sabrina Castro was last seen on 09/13/2017. Her primarily concern today is the Neck Pain (right)  Pain Assessment: Location: Right Neck Radiating: shoulder down arm to all fingers Onset: (2 weeks ago) Duration: Acute pain Quality: Tingling Severity: 7 /10 (subjective, self-reported pain score)  Note: Reported level is compatible with observation. Clinically the patient looks like a 2/10 A 2/10 is viewed as "Mild to Moderate" and described as noticeable and distracting. Impossible to hide from other people. More frequent flare-ups. Still possible to adapt and function close to normal. It can be very annoying and may have occasional stronger flare-ups. With discipline, patients may get used to it and adapt. Information on the proper use of the pain scale provided to the patient today. When using our objective Pain Scale, levels between 6 and 10/10 are said to belong in an emergency room, as it progressively worsens from a 6/10, described as severely limiting, requiring emergency care not usually available at an outpatient pain management facility. At a 6/10 level, communication becomes difficult and requires great effort. Assistance to reach the emergency department may be required. Facial flushing and  profuse sweating along with potentially dangerous increases in heart rate and blood pressure will be evident. Effect on ADL: ice Modifying factors: lifting, doing everything with left hand, right is her dominant, driving, hair, brushing teeth BP: 136/75  HR: 73  Further details on  both, my assessment(s), as well as the proposed treatment plan, please see below. She admits that she has had right sided neck pain for about 2 weeks. She feels like it was noticed with turning her head with driving. She feels like it is a surge of pain  that it goes into elbows and all her fingers. She is having numbness and tingling that goes down the arm and involves all her fingers.She admits that she had taken aleve but it was not effective. She admits that the gabapentin and the tramadol have not been effective for this either. She admits that at first she was not concern but because it has lingered she thought she better have an evaluation. Denies any previous history of neck or shoulder pain. She admits that she has been under increased stress but does not feel that he didn't has been any worse.  Laboratory Chemistry  Inflammation Markers (CRP: Acute Phase) (ESR: Chronic Phase) Lab Results  Component Value Date   CRP 28.4 (H) 09/13/2017   ESRSEDRATE 50 (H) 09/13/2017                         Rheumatology Markers No results found for: RF, ANA, LABURIC, URICUR, LYMEIGGIGMAB, LYMEABIGMQN, HLAB27                      Renal Function Markers Lab Results  Component Value Date   BUN 8 09/13/2017   CREATININE 0.90 09/13/2017   BCR 9 09/13/2017   GFRAA 83 09/13/2017   GFRNONAA 72 09/13/2017                             Hepatic Function Markers Lab Results  Component Value Date   AST 14 09/13/2017   ALBUMIN 4.2 09/13/2017   ALKPHOS 98 09/13/2017                        Electrolytes Lab Results  Component Value Date   NA 143 09/13/2017   K 4.2 09/13/2017   CL 101 09/13/2017   CALCIUM 10.5 (H) 09/13/2017    MG 1.9 09/13/2017                        Neuropathy Markers Lab Results  Component Value Date   VITAMINB12 >2000 (H) 09/13/2017                        Bone Pathology Markers Lab Results  Component Value Date   25OHVITD1 52 09/13/2017   25OHVITD2 1.9 09/13/2017   25OHVITD3 50 09/13/2017                         Coagulation Parameters Lab Results  Component Value Date   PLT 410 04/28/2017                        Cardiovascular Markers Lab Results  Component Value Date   TROPONINI <0.03 04/28/2017   HGB 11.7 (L) 04/28/2017   HCT 35.9 04/28/2017                         CA Markers No results found for: CEA, CA125, LABCA2  Note: Lab results reviewed.  Recent Diagnostic Imaging Review   Lumbosacral Imaging: Lumbar MR wo contrast:  Results for orders placed during the hospital encounter of 12/13/15  MR Lumbar Spine Wo Contrast   Narrative CLINICAL DATA:  Pain extending down both legs to the knees with numbness and tingling. Worse after standing.  EXAM: MRI LUMBAR SPINE WITHOUT CONTRAST  TECHNIQUE: Multiplanar, multisequence MR imaging of the lumbar spine was performed. No intravenous contrast was administered.  COMPARISON:  None.  FINDINGS: Segmentation: Transitional anatomy. 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 Correlation with this numbering scheme would be important should intervention be contemplated.  Alignment: Mild curvature convex to the left with the apex at L2-3. 3 mm anterolisthesis at L2-3. 5 mm anterolisthesis at L4-5.  Vertebrae: No primary bone lesion. Discogenic marrow changes at the L2-3 level.  Conus medullaris: Extends to the L1-2 level and appears normal.  Paraspinal and other soft tissues: No significant finding  Disc levels:  T12-L1 and L1-2: Normal.  L2-3: Disc degeneration with endplate osteophytes and shallow protrusion of the disc. Bilateral facet arthropathy with gaping, fluid-filled facet  joints. Anterolisthesis of 3 mm. This would likely worsen with standing or flexion. There is moderate stenosis of the canal. There is foraminal stenosis right worse than left. Discogenic marrow changes would likely be associated with back pain.  L3-4: Bilateral facet arthropathy left worse than right. Disc degeneration with mild bulging of the disc. Prominent epidural fat. Constriction of the thecal sac but no apparent neural compression. Mild foraminal narrowing on the left.  L4-5: Bilateral facet arthropathy with 5 mm of anterolisthesis. Bulging of the disc. Moderate stenosis of the lateral recesses and neural foramina that could be symptomatic.  L5-S1: Transitional and normal.  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  L2-3: Advanced bilateral facet arthropathy with gaping, fluid-filled facet joints. Anterolisthesis of 3 mm that could worsen with standing or flexion. Disc degeneration with endplate osteophytes and shallow protrusion of disc material more tripped the right canal stenosis and right foraminal stenosis that could cause neural compression. This appearance could worsen with standing or flexion endplate marrow changes that could be associated with back pain.  L3-4: Facet arthropathy left worse than right that could be symptomatic. Bulging of the disc more towards the left. Prominent epidural fat. Constriction of the thecal sac.  L4-5: Advanced bilateral facet arthropathy with anterolisthesis of 5 mm. Bulging of the disc. Multifactorial stenosis. Narrowing of the lateral recesses and neural foramina could be symptomatic.   Electronically Signed   By: Nelson Chimes M.D.   On: 12/13/2015 13:44    Results for orders placed during the hospital encounter of 11/02/17  DG Lumbar Spine Complete W/Bend   Narrative CLINICAL DATA:  Back pain.  BILATERAL sciatica.  EXAM: LUMBAR SPINE - COMPLETE WITH BENDING VIEWS  COMPARISON:  MRI lumbar spine  12/13/2015.  FINDINGS: Assessment of bony detail is hampered by inability of the x-ray beam to sufficiently penetrate the patient due to increased body habitus.  There is anterolisthesis L4 on L5 measuring 4 mm in lateral and extension, increasing to 5 mm in flexion. There is disc space narrowing at L2-3, L3-4, L4-5, and L5-S1. Sclerosis is seen of the endplates above and below L2-L3. Vacuum phenomenon is seen at L3-L4. There is lower lumbar facet arthropathy. It is difficult to assess for the presence or absence of pars defects. There is slight degenerative scoliosis convex LEFT at the upper lumbar region. Cholecystectomy clips.  IMPRESSION: Apparent  dynamic instability at L4-5, 4 mm slip in neutral increasing to 5 mm in flexion. This appears facet mediated.  Multilevel disc space narrowing L2-S1, which appears mildly progressed from prior MRI.   Electronically Signed   By: Staci Righter M.D.   On: 11/02/2017 15:55   Sacroiliac Joint Imaging: Sacroiliac Joint DG:  Results for orders placed during the hospital encounter of 09/13/17  DG Si Joints   Narrative CLINICAL DATA:  Chronic sacroiliac joint pain  EXAM: BILATERAL SACROILIAC JOINTS - 3+ VIEW  COMPARISON:  None.  FINDINGS: There is mild degenerative type spurring at the right sacroiliac joint. Bilateral hip spurring with dedicated hip views obtained contemporaneously. The L5 right transverse process is incompletely segmented from the right sacral ala, without superimposed spurring.  IMPRESSION: 1. Minor sacroiliac spurring on the right.  No erosive changes. 2. Bilateral degenerative hip spurring. 3. Incomplete segmentation of the L5 right transverse process from the sacrum. No superimposed spurringto suggest Bertolotti's syndrome.   Electronically Signed   By: Monte Fantasia M.D.   On: 09/14/2017 09:32   Hip Imaging:  Hip-R DG 2-3 views:  Results for orders placed during the hospital encounter of  09/13/17  DG HIP UNILAT W OR W/O PELVIS 2-3 VIEWS RIGHT   Narrative CLINICAL DATA:  Right hip pain  EXAM: DG HIP (WITH OR WITHOUT PELVIS) 2-3V RIGHT  COMPARISON:  None.  FINDINGS: Mild degenerative change in the right hip joint with joint space narrowing and mild spurring. Negative for fracture or AVN. No acute skeletal abnormality.  IMPRESSION: Mild degenerative change right hip   Electronically Signed   By: Franchot Gallo M.D.   On: 09/14/2017 09:23    Hip-L DG 2-3 views:  Results for orders placed during the hospital encounter of 09/13/17  DG HIP UNILAT W OR W/O PELVIS 2-3 VIEWS LEFT   Narrative CLINICAL DATA:  Low back pain and hip pain  EXAM: DG HIP (WITH OR WITHOUT PELVIS) 2-3V LEFT  COMPARISON:  None.  FINDINGS: Normal alignment. No fracture or mass. No AVN. Mild degenerative changes in the left hip joint with mild joint space narrowing and spurring.  IMPRESSION: Mild degenerative change left hip without acute abnormality.   Electronically Signed   By: Franchot Gallo M.D.   On: 09/14/2017 09:22   Knee Imaging: Donnetta Hail DG 1-2 views:  Results for orders placed during the hospital encounter of 09/13/17  DG Knee 1-2 Views Right   Narrative CLINICAL DATA:  Chronic right knee pain.  EXAM: RIGHT KNEE - 1-2 VIEW  COMPARISON:  Right knee x-rays dated June 05, 2012.  FINDINGS: No acute fracture or dislocation. No significant joint effusion. New mild to moderate medial compartment joint space narrowing with marginal osteophytes. Small marginal lateral compartment osteophytes. Quadriceps and patellar enthesopathy. Bone mineralization is normal. Soft tissues are unremarkable.  IMPRESSION: 1. New mild to moderate medial compartment osteoarthritis.   Electronically Signed   By: Titus Dubin M.D.   On: 09/14/2017 09:23    Knee-L DG 1-2 views:  Results for orders placed during the hospital encounter of 09/13/17  DG Knee 1-2 Views Left    Narrative CLINICAL DATA:  57 year old female with a history of back pain  EXAM: LEFT KNEE - 1-2 VIEW  COMPARISON:  06/05/2012  FINDINGS: No acute displaced fracture. Medial greater than lateral joint space narrowing with marginal osteophyte formation and subchondral sclerosis. Patellofemoral degenerative changes. No joint effusion. No focal soft tissue swelling. No radiopaque foreign body.  IMPRESSION: Negative  for acute bony abnormality.  Tricompartmental osteoarthritis   Electronically Signed   By: Corrie Mckusick D.O.   On: 09/14/2017 08:50     Complexity Note: Imaging results reviewed. Results shared with Ms. Lyter, using Layman's terms.                         Meds   Current Outpatient Medications:  .  amLODipine (NORVASC) 5 MG tablet, Take 5 mg by mouth daily., Disp: , Rfl:  .  DULoxetine (CYMBALTA) 60 MG capsule, Take 60 mg by mouth daily., Disp: , Rfl:  .  ergocalciferol (VITAMIN D2) 50000 units capsule, Take 50,000 Units by mouth once a week., Disp: , Rfl:  .  ferrous sulfate 325 (65 FE) MG tablet, Take 325 mg by mouth daily with breakfast., Disp: , Rfl:  .  gabapentin (NEURONTIN) 300 MG capsule, Take 1-3 capsules (300-900 mg total) by mouth 4 (four) times daily. Follow the written titration schedule., Disp: 360 capsule, Rfl: 2 .  metFORMIN (GLUCOPHAGE) 500 MG tablet, Take 500 mg by mouth daily., Disp: , Rfl:  .  metoprolol succinate (TOPROL-XL) 50 MG 24 hr tablet, Take 50 mg by mouth daily. Take with or immediately following a meal., Disp: , Rfl:  .  Multiple Vitamin (MULTIVITAMIN) tablet, Take 1 tablet by mouth daily., Disp: , Rfl:  .  olmesartan-hydrochlorothiazide (BENICAR HCT) 20-12.5 MG tablet, Take 1 tablet by mouth daily., Disp: , Rfl:  .  pantoprazole (PROTONIX) 40 MG tablet, Take 40 mg by mouth daily., Disp: , Rfl:  .  traZODone (DESYREL) 50 MG tablet, Take 50 mg by mouth at bedtime., Disp: , Rfl:  .  traMADol (ULTRAM) 50 MG tablet, Take 1-2 tablets (50-100  mg total) by mouth every 6 (six) hours as needed for severe pain., Disp: 240 tablet, Rfl: 0  ROS  Constitutional: Denies any fever or chills Gastrointestinal: No reported hemesis, hematochezia, vomiting, or acute GI distress Musculoskeletal: Denies any acute onset joint swelling, redness, loss of ROM, or weakness Neurological: No reported episodes of acute onset apraxia, aphasia, dysarthria, agnosia, amnesia, paralysis, loss of coordination, or loss of consciousness  Allergies  Ms. Godsil is allergic to lisinopril.  [place]  Drug: Ms. Borys  reports that she does not use drugs. Alcohol:  reports that she does not drink alcohol. Tobacco:  reports that she has never smoked. She has never used smokeless tobacco. Medical:  has a past medical history of Depression, GERD (gastroesophageal reflux disease), and Hypertension. Surgical: Ms. Corso  has a past surgical history that includes Colonoscopy with propofol (N/A, 01/09/2016); Esophagogastroduodenoscopy (egd) with propofol (N/A, 01/09/2016); and Cholecystectomy open. Family: family history includes Breast cancer in her maternal aunt.  Constitutional Exam  General appearance: Well nourished, well developed, and well hydrated. In no apparent acute distress Vitals:   01/03/18 0852  BP: 136/75  Pulse: 73  Resp: 18  Temp: 98 F (36.7 C)  SpO2: 100%  Weight: (!) 336 lb (152.4 kg)  Height: '5\' 4"'  (1.626 m)   BMI Assessment: Estimated body mass index is 57.67 kg/m as calculated from the following:   Height as of this encounter: '5\' 4"'  (1.626 m).   Weight as of this encounter: 336 lb (152.4 kg). Psych/Mental status: Alert, oriented x 3 (person, place, & time)       Eyes: PERLA Respiratory: No evidence of acute respiratory distress  Cervical Spine Area Exam  Skin & Axial Inspection: No masses, redness, edema, swelling, or associated  skin lesions Alignment: Symmetrical Functional ROM: Guarding     to the right with painful range of  motion Stability: No instability detected Muscle Tone/Strength: Guarding observed Sensory (Neurological): Unimpaired Palpation: Complains of area being tender to palpation Cervical compression test positive      on the right  Upper Extremity (UE) Exam    Side: Right upper extremity  Side: Left upper extremity  Skin & Extremity Inspection: No gross anomalies detected  Skin & Extremity Inspection: Skin color, temperature, and hair growth are WNL. No peripheral edema or cyanosis. No masses, redness, swelling, asymmetry, or associated skin lesions. No contractures.  Functional ROM: painful range of motion however adequate  Functional ROM: Unrestricted ROM          Muscle Tone/Strength: Normal strength (5/5)  Muscle Tone/Strength: Normal strength (5/5)  Sensory (Neurological): Unimpaired          Sensory (Neurological): Unimpaired          Palpation: Increased muscle tone              Palpation: Increased muscle tone              Provocative Test(s):  Phalen's test: deferred Tinel's test: deferred Apley's scratch test (touch opposite shoulder):  Action 1 (Across chest): deferred Action 2 (Overhead): Decreased ROM Action 3 (LB reach): Decreased ROM   Provocative Test(s):  Phalen's test: deferred Tinel's test: deferred Apley's scratch test (touch opposite shoulder):  Action 1 (Across chest): deferred Action 2 (Overhead): Adequate ROM Action 3 (LB reach): Adequate ROM    Thoracic Spine Area Exam  Skin & Axial Inspection: No masses, redness, or swelling Alignment: Symmetrical Functional ROM: Unrestricted ROM Stability: No instability detected Muscle Tone/Strength: Functionally intact. No obvious neuro-muscular anomalies detected. Sensory (Neurological): Unimpaired Muscle strength & Tone: No palpable anomalies  Gait & Posture Assessment  Ambulation: Unassisted Gait: Relatively normal for age and body habitus Posture: WNL   Assessment  Primary Diagnosis & Pertinent Problem List: The  primary encounter diagnosis was Neck pain. Diagnoses of Right shoulder pain, unspecified chronicity, Myofascial pain, and Chronic pain syndrome were also pertinent to this visit.  Status Diagnosis  Not responding Not responding Not responding 1. Neck pain   2. Right shoulder pain, unspecified chronicity   3. Myofascial pain   4. Chronic pain syndrome     Problems updated and reviewed during this visit: Problem  Neck Pain  Myofascial Pain   Plan of Care  Pharmacotherapy (Medications Ordered): Meds ordered this encounter  Medications  . orphenadrine (NORFLEX) injection 60 mg  . ketorolac (TORADOL) injection 60 mg   New Prescriptions   No medications on file   Medications administered today: We administered orphenadrine and ketorolac. Lab-work, procedure(s), and/or referral(s): Orders Placed This Encounter  Procedures  . Injection tendon or ligament  . DG Cervical Spine With Flex & Extend  . DG Shoulder Right   Imaging and/or referral(s): DG CERVICAL SPINE WITH FLEX & EXTEND DG SHOULDER RIGHT  Interventional therapies: Planned, scheduled, and/or pending:   Not at this time.  Declined oral muscle relaxer at this time. Will complete images and see if IM injections are effective for neck and arm pain. Will consider trigger point injections if not effective at next visit. Apt as scheduled. Diagnostic bilateral lumbar facet block#2 under fluoroscopic guidance and IV sedation    Considering:   Diagnostic bilateral lumbar facet block Possible bilateral lumbar facet RFA Diagnostic bilateral sacroiliac joint block Possible bilateral sacroiliac joint RFA Diagnostic L2-3 LESI  Diagnostic L3-4 LESI Diagnostic L4-5 LESI Diagnostic right-sided L2-3 transforaminalESI Diagnostic bilateral L4-5 transforaminal ESI Diagnostic bilateral intra-articular hip joint injection Diagnostic bilateral femoral nerve +obturatornerve block Possible bilateral femoral nerve  +obturator nerve RFA Diagnostic bilateral intra-articular knee injectionwith local anesthetic and steroid  Possible bilateral, series of 5, intra-articular Hyalgan knee injections Diagnostic bilateral genicular nerve block Possible bilateral genicular nerve RFA   Palliative PRN treatment(s):   Palliative bilateral lumbar facet block    Provider-requested follow-up: Return for Appointment As Scheduled.  Future Appointments  Date Time Provider [place]  01/05/2018  9:15 AM Milinda Pointer, MD [facility] None   Primary Care Physician: Sabrina Harrier, MD Location: [facility] Outpatient Pain Management Facility Note by: Sabrina Francois NP Date: 01/03/2018; Time: 9:55 AM  Pain Score Disclaimer: We use the NRS-11 scale. This is a self-reported, subjective measurement of pain severity with only modest accuracy. It is used primarily to identify changes within a particular patient. It must be understood that outpatient pain scales are significantly less accurate that those used for research, where they can be applied under ideal controlled circumstances with minimal exposure to variables. In reality, the score is likely to be a combination of pain intensity and pain affect, where pain affect describes the degree of emotional arousal or changes in action readiness caused by the sensory experience of pain. Factors such as social and work situation, setting, emotional state, anxiety levels, expectation, and prior pain experience may influence pain perception and show large inter-individual differences that may also be affected by time variables.  Patient instructions provided during this appointment: Patient Instructions   ____________________________________________________________________________________________  Pain Scale  Introduction: The pain score used by this practice is the Verbal Numerical Rating Scale (VNRS-11). This is an 11-point scale. It is for adults and children 10  years or older. There are significant differences in how the pain score is reported, used, and applied. Forget everything you learned in the past and learn this scoring system.  General Information: The scale should reflect your current level of pain. Unless you are specifically asked for the level of your worst pain, or your average pain. If you are asked for one of these two, then it should be understood that it is over the past 24 hours.  Basic Activities of Daily Living (ADL): Personal hygiene, dressing, eating, transferring, and using restroom.  Instructions: Most patients tend to report their level of pain as a combination of two factors, their physical pain and their psychosocial pain. This last one is also known as "suffering" and it is reflection of how physical pain affects you socially and psychologically. From now on, report them separately. From this point on, when asked to report your pain level, report only your physical pain. Use the following table for reference.  Pain Clinic Pain Levels (0-5/10)  Pain Level Score  Description  No Pain 0   Mild pain 1 Nagging, annoying, but does not interfere with basic activities of daily living (ADL). Patients are able to eat, bathe, get dressed, toileting (being able to get on and off the toilet and perform personal hygiene functions), transfer (move in and out of bed or a chair without assistance), and maintain continence (able to control bladder and bowel functions). Blood pressure and heart rate are unaffected. A normal heart rate for a healthy adult ranges from 60 to 100 bpm (beats per minute).   Mild to moderate pain 2 Noticeable and distracting. Impossible to hide from other people. More frequent flare-ups. Still possible  to adapt and function close to normal. It can be very annoying and may have occasional stronger flare-ups. With discipline, patients may get used to it and adapt.   Moderate pain 3 Interferes significantly with activities of  daily living (ADL). It becomes difficult to feed, bathe, get dressed, get on and off the toilet or to perform personal hygiene functions. Difficult to get in and out of bed or a chair without assistance. Very distracting. With effort, it can be ignored when deeply involved in activities.   Moderately severe pain 4 Impossible to ignore for more than a few minutes. With effort, patients may still be able to manage work or participate in some social activities. Very difficult to concentrate. Signs of autonomic nervous system discharge are evident: dilated pupils (mydriasis); mild sweating (diaphoresis); sleep interference. Heart rate becomes elevated (>115 bpm). Diastolic blood pressure (lower number) rises above 100 mmHg. Patients find relief in laying down and not moving.   Severe pain 5 Intense and extremely unpleasant. Associated with frowning face and frequent crying. Pain overwhelms the senses.  Ability to do any activity or maintain social relationships becomes significantly limited. Conversation becomes difficult. Pacing back and forth is common, as getting into a comfortable position is nearly impossible. Pain wakes you up from deep sleep. Physical signs will be obvious: pupillary dilation; increased sweating; goosebumps; brisk reflexes; cold, clammy hands and feet; nausea, vomiting or dry heaves; loss of appetite; significant sleep disturbance with inability to fall asleep or to remain asleep. When persistent, significant weight loss is observed due to the complete loss of appetite and sleep deprivation.  Blood pressure and heart rate becomes significantly elevated. Caution: If elevated blood pressure triggers a pounding headache associated with blurred vision, then the patient should immediately seek attention at an urgent or emergency care unit, as these may be signs of an impending stroke.    Emergency Department Pain Levels (6-10/10)  Emergency Room Pain 6 Severely limiting. Requires emergency  care and should not be seen or managed at an outpatient pain management facility. Communication becomes difficult and requires great effort. Assistance to reach the emergency department may be required. Facial flushing and profuse sweating along with potentially dangerous increases in heart rate and blood pressure will be evident.   Distressing pain 7 Self-care is very difficult. Assistance is required to transport, or use restroom. Assistance to reach the emergency department will be required. Tasks requiring coordination, such as bathing and getting dressed become very difficult.   Disabling pain 8 Self-care is no longer possible. At this level, pain is disabling. The individual is unable to do even the most "basic" activities such as walking, eating, bathing, dressing, transferring to a bed, or toileting. Fine motor skills are lost. It is difficult to think clearly.   Incapacitating pain 9 Pain becomes incapacitating. Thought processing is no longer possible. Difficult to remember your own name. Control of movement and coordination are lost.   The worst pain imaginable 10 At this level, most patients pass out from pain. When this level is reached, collapse of the autonomic nervous system occurs, leading to a sudden drop in blood pressure and heart rate. This in turn results in a temporary and dramatic drop in blood flow to the brain, leading to a loss of consciousness. Fainting is one of the body's self defense mechanisms. Passing out puts the brain in a calmed state and causes it to shut down for a while, in order to begin the healing process.  Summary: 1. Refer to this scale when providing [place] with your pain level. 2. Be accurate and careful when reporting your pain level. This will help with your care. 3. Over-reporting your pain level will lead to loss of credibility. 4. Even a level of 1/10 means that there is pain and will be treated at our facility. 5. High, inaccurate reporting will be  documented as "Symptom Exaggeration", leading to loss of credibility and suspicions of possible secondary gains such as obtaining more narcotics, or wanting to appear disabled, for fraudulent reasons. 6. Only pain levels of 5 or below will be seen at our facility. 7. Pain levels of 6 and above will be sent to the Emergency Department and the appointment cancelled. ____________________________________________________________________________________________

## 2018-01-03 NOTE — Patient Instructions (Signed)

## 2018-01-03 NOTE — Progress Notes (Signed)
Safety precautions to be maintained throughout the outpatient stay will include: orient to surroundings, keep bed in low position, maintain call bell within reach at all times, provide assistance with transfer out of bed and ambulation.  

## 2018-01-05 ENCOUNTER — Ambulatory Visit (HOSPITAL_BASED_OUTPATIENT_CLINIC_OR_DEPARTMENT_OTHER): Payer: BLUE CROSS/BLUE SHIELD | Admitting: Pain Medicine

## 2018-01-05 ENCOUNTER — Other Ambulatory Visit: Payer: Self-pay

## 2018-01-05 ENCOUNTER — Encounter: Payer: Self-pay | Admitting: Pain Medicine

## 2018-01-05 ENCOUNTER — Ambulatory Visit
Admission: RE | Admit: 2018-01-05 | Discharge: 2018-01-05 | Disposition: A | Discharge status: Home / Self Care | Payer: BLUE CROSS/BLUE SHIELD | Source: Ambulatory Visit | Attending: Pain Medicine | Admitting: Pain Medicine

## 2018-01-05 VITALS — BP 118/85 | HR 60 | Temp 97.1°F | Resp 29 | Ht 64.0 in | Wt 337.0 lb

## 2018-01-05 DIAGNOSIS — Z79891 Long term (current) use of opiate analgesic: Secondary | ICD-10-CM | POA: Insufficient documentation

## 2018-01-05 DIAGNOSIS — M545 Low back pain, unspecified: Secondary | ICD-10-CM

## 2018-01-05 DIAGNOSIS — M47817 Spondylosis without myelopathy or radiculopathy, lumbosacral region: Secondary | ICD-10-CM | POA: Diagnosis not present

## 2018-01-05 DIAGNOSIS — G8929 Other chronic pain: Secondary | ICD-10-CM

## 2018-01-05 DIAGNOSIS — Z9049 Acquired absence of other specified parts of digestive tract: Secondary | ICD-10-CM | POA: Insufficient documentation

## 2018-01-05 DIAGNOSIS — M47816 Spondylosis without myelopathy or radiculopathy, lumbar region: Secondary | ICD-10-CM

## 2018-01-05 DIAGNOSIS — Z9889 Other specified postprocedural states: Secondary | ICD-10-CM | POA: Diagnosis not present

## 2018-01-05 DIAGNOSIS — R937 Abnormal findings on diagnostic imaging of other parts of musculoskeletal system: Secondary | ICD-10-CM | POA: Insufficient documentation

## 2018-01-05 DIAGNOSIS — M25519 Pain in unspecified shoulder: Secondary | ICD-10-CM | POA: Diagnosis present

## 2018-01-05 DIAGNOSIS — Z79899 Other long term (current) drug therapy: Secondary | ICD-10-CM | POA: Diagnosis not present

## 2018-01-05 DIAGNOSIS — M5030 Other cervical disc degeneration, unspecified cervical region: Secondary | ICD-10-CM

## 2018-01-05 MED ORDER — FENTANYL CITRATE (PF) 100 MCG/2ML IJ SOLN
25.0000 ug | INTRAMUSCULAR | Status: DC | PRN
  Administered 2018-01-05: 50 ug via INTRAVENOUS

## 2018-01-05 MED ORDER — TRIAMCINOLONE ACETONIDE 40 MG/ML IJ SUSP
80.0000 mg | Freq: Once | INTRAMUSCULAR | Status: AC
  Administered 2018-01-05: 80 mg

## 2018-01-05 MED ORDER — TRIAMCINOLONE ACETONIDE 40 MG/ML IJ SUSP
INTRAMUSCULAR | Status: AC
  Filled 2018-01-05: qty 2

## 2018-01-05 MED ORDER — MIDAZOLAM HCL 5 MG/5ML IJ SOLN
INTRAMUSCULAR | Status: AC
  Filled 2018-01-05: qty 5

## 2018-01-05 MED ORDER — ROPIVACAINE HCL 2 MG/ML IJ SOLN
INTRAMUSCULAR | Status: AC
  Filled 2018-01-05: qty 20

## 2018-01-05 MED ORDER — LIDOCAINE HCL 2 % IJ SOLN
20.0000 mL | Freq: Once | INTRAMUSCULAR | Status: AC
  Administered 2018-01-05: 20 mg

## 2018-01-05 MED ORDER — ROPIVACAINE HCL 2 MG/ML IJ SOLN
18.0000 mL | Freq: Once | INTRAMUSCULAR | Status: AC
  Administered 2018-01-05: 9 mL via PERINEURAL

## 2018-01-05 MED ORDER — LACTATED RINGERS IV SOLN
1000.0000 mL | Freq: Once | INTRAVENOUS | Status: AC
  Administered 2018-01-05: 1000 mL via INTRAVENOUS

## 2018-01-05 MED ORDER — LIDOCAINE HCL 2 % IJ SOLN
INTRAMUSCULAR | Status: AC
  Filled 2018-01-05: qty 20

## 2018-01-05 MED ORDER — FENTANYL CITRATE (PF) 100 MCG/2ML IJ SOLN
INTRAMUSCULAR | Status: AC
  Filled 2018-01-05: qty 2

## 2018-01-05 MED ORDER — MIDAZOLAM HCL 5 MG/5ML IJ SOLN
1.0000 mg | INTRAMUSCULAR | Status: DC | PRN
  Administered 2018-01-05: 2 mg via INTRAVENOUS

## 2018-01-05 NOTE — Progress Notes (Signed)
Patient's Name: Sabrina Castro  MRN: [id number]  Referring Provider: Tracie Harrier, MD  DOB: 1961-04-17  PCP: Tracie Harrier, MD  DOS: 01/05/2018  Note by: Gaspar Cola, MD  Service setting: Ambulatory outpatient  Specialty: Interventional Pain Management  Patient type: Established  Location: [facility] (AMB) Pain Management Facility  Visit type: Interventional Procedure   Primary Reason for Visit: Interventional Pain Management Treatment. CC: Shoulder Pain  Procedure:          Anesthesia, Analgesia, Anxiolysis:  Type: Lumbar Facet, Medial Branch Block(s) #2  Primary Purpose: Diagnostic Region: Posterolateral Lumbosacral Spine Level: L2, L3, L4, L5, & S1 Medial Branch Level(s). Injecting these levels blocks the L3-4, L4-5, and L5-S1 lumbar facet joints. Laterality: Bilateral  Type: Moderate (Conscious) Sedation combined with Local Anesthesia Indication(s): Analgesia and Anxiety Route: Intravenous (IV) IV Access: Secured Sedation: Meaningful verbal contact was maintained at all times during the procedure  Local Anesthetic: Lidocaine 1-2%   Indications: 1. Spondylosis without myelopathy or radiculopathy, lumbosacral region   2. Lumbar facet arthropathy (L2-3, L3-4, L4-5) (Bilateral)   3. Lumbar facet syndrome (Bilateral) (L>R)   4. Chronic low back pain (Primary Area of Pain) (Bilateral) (L>R) w/o sciatica    Pain Score: Pre-procedure: 8 /10 Post-procedure: 0-No pain/10  Pre-op Assessment:  Sabrina Castro is a 57 y.o. (year old), female patient, seen today for interventional treatment. She  has a past surgical history that includes Colonoscopy with propofol (N/A, 01/09/2016); Esophagogastroduodenoscopy (egd) with propofol (N/A, 01/09/2016); and Cholecystectomy open. Sabrina Castro has a current medication list which includes the following prescription(s): amlodipine, duloxetine, ergocalciferol, ferrous sulfate, gabapentin, metformin, metoprolol succinate, multivitamin,  olmesartan-hydrochlorothiazide, pantoprazole, trazodone, and tramadol, and the following Facility-Administered Medications: fentanyl, lactated ringers, and midazolam. Her primarily concern today is the Shoulder Pain  Initial Vital Signs:  Pulse/HCG Rate: 66ECG Heart Rate: 60 Temp: 97.8 F (36.6 C) Resp: (!) 100 BP: 96/65 SpO2: 97 %  BMI: Estimated body mass index is 57.85 kg/m as calculated from the following:   Height as of this encounter: 5\' 4"  (1.626 m).   Weight as of this encounter: 337 lb (152.9 kg).  Risk Assessment: Allergies: Reviewed. She is allergic to lisinopril.  Allergy Precautions: None required Coagulopathies: Reviewed. None identified.  Blood-thinner therapy: None at this time Active Infection(s): Reviewed. None identified. Sabrina Castro is afebrile  Site Confirmation: Sabrina Castro was asked to confirm the procedure and laterality before marking the site Procedure checklist: Completed Consent: Before the procedure and under the influence of no sedative(s), amnesic(s), or anxiolytics, the patient was informed of the treatment options, risks and possible complications. To fulfill our ethical and legal obligations, as recommended by the American Medical Association's Code of Ethics, I have informed the patient of my clinical impression; the nature and purpose of the treatment or procedure; the risks, benefits, and possible complications of the intervention; the alternatives, including doing nothing; the risk(s) and benefit(s) of the alternative treatment(s) or procedure(s); and the risk(s) and benefit(s) of doing nothing. The patient was provided information about the general risks and possible complications associated with the procedure. These may include, but are not limited to: failure to achieve desired goals, infection, bleeding, organ or nerve damage, allergic reactions, paralysis, and death. In addition, the patient was informed of those risks and complications associated to  Spine-related procedures, such as failure to decrease pain; infection (i.e.: Meningitis, epidural or intraspinal abscess); bleeding (i.e.: epidural hematoma, subarachnoid hemorrhage, or any other type of intraspinal or peri-dural bleeding); organ or nerve damage (i.e.:  Any type of peripheral nerve, nerve root, or spinal cord injury) with subsequent damage to sensory, motor, and/or autonomic systems, resulting in permanent pain, numbness, and/or weakness of one or several areas of the body; allergic reactions; (i.e.: anaphylactic reaction); and/or death. Furthermore, the patient was informed of those risks and complications associated with the medications. These include, but are not limited to: allergic reactions (i.e.: anaphylactic or anaphylactoid reaction(s)); adrenal axis suppression; blood sugar elevation that in diabetics may result in ketoacidosis or comma; water retention that in patients with history of congestive heart failure may result in shortness of breath, pulmonary edema, and decompensation with resultant heart failure; weight gain; swelling or edema; medication-induced neural toxicity; particulate matter embolism and blood vessel occlusion with resultant organ, and/or nervous system infarction; and/or aseptic necrosis of one or more joints. Finally, the patient was informed that Medicine is not an exact science; therefore, there is also the possibility of unforeseen or unpredictable risks and/or possible complications that may result in a catastrophic outcome. The patient indicated having understood very clearly. We have given the patient no guarantees and we have made no promises. Enough time was given to the patient to ask questions, all of which were answered to the patient's satisfaction. Sabrina Castro has indicated that she wanted to continue with the procedure. Attestation: I, the ordering provider, attest that I have discussed with the patient the benefits, risks, side-effects, alternatives,  likelihood of achieving goals, and potential problems during recovery for the procedure that I have provided informed consent. Date  Time: 01/05/2018  8:57 AM  Pre-Procedure Preparation:  Monitoring: As per clinic protocol. Respiration, ETCO2, SpO2, BP, heart rate and rhythm monitor placed and checked for adequate function Safety Precautions: Patient was assessed for positional comfort and pressure points before starting the procedure. Time-out: I initiated and conducted the "Time-out" before starting the procedure, as per protocol. The patient was asked to participate by confirming the accuracy of the "Time Out" information. Verification of the correct person, site, and procedure were performed and confirmed by me, the nursing staff, and the patient. "Time-out" conducted as per Joint Commission's Universal Protocol (UP.01.01.01). Time: 0931  Description of Procedure:          Position: Prone Laterality: Bilateral. The procedure was performed in identical fashion on both sides. Levels:  L2, L3, L4, L5, & S1 Medial Branch Level(s) Area Prepped: Posterior Lumbosacral Region Prepping solution: ChloraPrep (2% chlorhexidine gluconate and 70% isopropyl alcohol) Safety Precautions: Aspiration looking for blood return was conducted prior to all injections. At no point did we inject any substances, as a needle was being advanced. Before injecting, the patient was told to immediately notify me if she was experiencing any new onset of "ringing in the ears, or metallic taste in the mouth". No attempts were made at seeking any paresthesias. Safe injection practices and needle disposal techniques used. Medications properly checked for expiration dates. SDV (single dose vial) medications used. After the completion of the procedure, all disposable equipment used was discarded in the proper designated medical waste containers. Local Anesthesia: Protocol guidelines were followed. The patient was positioned over the  fluoroscopy table. The area was prepped in the usual manner. The time-out was completed. The target area was identified using fluoroscopy. A 12-in long, straight, sterile hemostat was used with fluoroscopic guidance to locate the targets for each level blocked. Once located, the skin was marked with an approved surgical skin marker. Once all sites were marked, the skin (epidermis, dermis, and hypodermis), as well  as deeper tissues (fat, connective tissue and muscle) were infiltrated with a small amount of a short-acting local anesthetic, loaded on a 10cc syringe with a 25G, 1.5-in  Needle. An appropriate amount of time was allowed for local anesthetics to take effect before proceeding to the next step. Local Anesthetic: Lidocaine 2.0% The unused portion of the local anesthetic was discarded in the proper designated containers. Technical explanation of process:  L2 Medial Branch Nerve Block (MBB): The target area for the L2 medial branch is at the junction of the postero-lateral aspect of the superior articular process and the superior, posterior, and medial edge of the transverse process of L3. Under fluoroscopic guidance, a Quincke needle was inserted until contact was made with os over the superior postero-lateral aspect of the pedicular shadow (target area). After negative aspiration for blood, 0.5 mL of the nerve block solution was injected without difficulty or complication. The needle was removed intact. L3 Medial Branch Nerve Block (MBB): The target area for the L3 medial branch is at the junction of the postero-lateral aspect of the superior articular process and the superior, posterior, and medial edge of the transverse process of L4. Under fluoroscopic guidance, a Quincke needle was inserted until contact was made with os over the superior postero-lateral aspect of the pedicular shadow (target area). After negative aspiration for blood, 0.5 mL of the nerve block solution was injected without difficulty  or complication. The needle was removed intact. L4 Medial Branch Nerve Block (MBB): The target area for the L4 medial branch is at the junction of the postero-lateral aspect of the superior articular process and the superior, posterior, and medial edge of the transverse process of L5. Under fluoroscopic guidance, a Quincke needle was inserted until contact was made with os over the superior postero-lateral aspect of the pedicular shadow (target area). After negative aspiration for blood, 0.5 mL of the nerve block solution was injected without difficulty or complication. The needle was removed intact. L5 Medial Branch Nerve Block (MBB): The target area for the L5 medial branch is at the junction of the postero-lateral aspect of the superior articular process and the superior, posterior, and medial edge of the sacral ala. Under fluoroscopic guidance, a Quincke needle was inserted until contact was made with os over the superior postero-lateral aspect of the pedicular shadow (target area). After negative aspiration for blood, 0.5 mL of the nerve block solution was injected without difficulty or complication. The needle was removed intact. S1 Medial Branch Nerve Block (MBB): The target area for the S1 medial branch is at the posterior and inferior 6 o'clock position of the L5-S1 facet joint. Under fluoroscopic guidance, the Quincke needle inserted for the L5 MBB was redirected until contact was made with os over the inferior and postero aspect of the sacrum, at the 6 o' clock position under the L5-S1 facet joint (Target area). After negative aspiration for blood, 0.5 mL of the nerve block solution was injected without difficulty or complication. The needle was removed intact. Procedural Needles: 22-gauge, 3.5-inch, Quincke needles used for all levels. Nerve block solution: 0.2% PF-Ropivacaine + Triamcinolone (40 mg/mL) diluted to a final concentration of 4 mg of Triamcinolone/mL of Ropivacaine The unused portion of  the solution was discarded in the proper designated containers.  Once the entire procedure was completed, the treated area was cleaned, making sure to leave some of the prepping solution back to take advantage of its long term bactericidal properties.   Illustration of the posterior view of the  lumbar spine and the posterior neural structures. Laminae of L2 through S1 are labeled. DPRL5, dorsal primary ramus of L5; DPRS1, dorsal primary ramus of S1; DPR3, dorsal primary ramus of L3; FJ, facet (zygapophyseal) joint L3-L4; I, inferior articular process of L4; LB1, lateral branch of dorsal primary ramus of L1; IAB, inferior articular branches from L3 medial branch (supplies L4-L5 facet joint); IBP, intermediate branch plexus; MB3, medial branch of dorsal primary ramus of L3; NR3, third lumbar nerve root; S, superior articular process of L5; SAB, superior articular branches from L4 (supplies L4-5 facet joint also); TP3, transverse process of L3.  Vitals:   01/05/18 0948 01/05/18 0952 01/05/18 1002 01/05/18 1012  BP: (!) 101/56 118/64 117/66 118/85  Pulse: 60     Resp: 12 13 15  (!) 29  Temp:  (!) 97.3 F (36.3 C)  (!) 97.1 F (36.2 C)  TempSrc:  Temporal  Temporal  SpO2: 97% 100% 100% 100%  Weight:      Height:        Start Time: 0931 hrs. End Time: 0945 hrs.  Imaging Guidance (Spinal):          Type of Imaging Technique: Fluoroscopy Guidance (Spinal) Indication(s): Assistance in needle guidance and placement for procedures requiring needle placement in or near specific anatomical locations not easily accessible without such assistance. Exposure Time: Please see nurses notes. Contrast: None used. Fluoroscopic Guidance: I was personally present during the use of fluoroscopy. "Tunnel Vision Technique" used to obtain the best possible view of the target area. Parallax error corrected before commencing the procedure. "Direction-depth-direction" technique used to introduce the needle under  continuous pulsed fluoroscopy. Once target was reached, antero-posterior, oblique, and lateral fluoroscopic projection used confirm needle placement in all planes. Images permanently stored in EMR. Interpretation: No contrast injected. I personally interpreted the imaging intraoperatively. Adequate needle placement confirmed in multiple planes. Permanent images saved into the patient's record.  Antibiotic Prophylaxis:   Anti-infectives (From admission, onward)   None     Indication(s): None identified  Post-operative Assessment:  Post-procedure Vital Signs:  Pulse/HCG Rate: 6070 Temp: (!) 97.1 F (36.2 C) Resp: (!) 29 BP: 118/85 SpO2: 100 %  EBL: None  Complications: No immediate post-treatment complications observed by team, or reported by patient.  Note: The patient tolerated the entire procedure well. A repeat set of vitals were taken after the procedure and the patient was kept under observation following institutional policy, for this type of procedure. Post-procedural neurological assessment was performed, showing return to baseline, prior to discharge. The patient was provided with post-procedure discharge instructions, including a section on how to identify potential problems. Should any problems arise concerning this procedure, the patient was given instructions to immediately contact us, at any time, without hesitation. In any case, we plan to contact the patient by telephone for a follow-up status report regarding this interventional procedure.  Comments:  No additional relevant information.  Plan of Care   Imaging Orders     DG C-Arm 1-60 Min-No Report  Procedure Orders     LUMBAR FACET(MEDIAL BRANCH NERVE BLOCK) MBNB     Cervical Epidural Injection  Medications ordered for procedure: Meds ordered this encounter  Medications  . lidocaine (XYLOCAINE) 2 % (with pres) injection 400 mg  . midazolam (VERSED) 5 MG/5ML injection 1-2 mg    Make sure Flumazenil is  available in the pyxis when using this medication. If oversedation occurs, administer 0.2 mg IV over 15 sec. If after 45 sec no response, administer  0.2 mg again over 1 min; may repeat at 1 min intervals; not to exceed 4 doses (1 mg)  . fentaNYL (SUBLIMAZE) injection 25-50 mcg    Make sure Narcan is available in the pyxis when using this medication. In the event of respiratory depression (RR< 8/min): Titrate NARCAN (naloxone) in increments of 0.1 to 0.2 mg IV at 2-3 minute intervals, until desired degree of reversal.  . lactated ringers infusion 1,000 mL  . ropivacaine (PF) 2 mg/mL (0.2%) (NAROPIN) injection 18 mL  . triamcinolone acetonide (KENALOG-40) injection 80 mg   Medications administered: We administered lidocaine, midazolam, fentaNYL, lactated ringers, ropivacaine (PF) 2 mg/mL (0.2%), and triamcinolone acetonide.  See the medical record for exact dosing, route, and time of administration.  New Prescriptions   No medications on file   Disposition: Discharge home  Discharge Date & Time: 01/05/2018; 1015 hrs.   Physician-requested Follow-up: Return for PPE (2 wks) + Procedure (w/ sedation): (R) CESI #1.  Future Appointments  Date Time Provider [place]  01/19/2018  9:15 AM Milinda Pointer, MD [facility] None   Primary Care Physician: Tracie Harrier, MD Location: [facility] Note by: Gaspar Cola, MD Date: 01/05/2018; Time: 10:20 AM  Disclaimer:  Medicine is not an exact science. The only guarantee in medicine is that nothing is guaranteed. It is important to note that the decision to proceed with this intervention was based on the information collected from the patient. The Data and conclusions were drawn from the patient's questionnaire, the interview, and the physical examination. Because the information was provided in large part by the patient, it cannot be guaranteed that it has not been purposely or unconsciously manipulated.  Every effort has been made to obtain as much relevant data as possible for this evaluation. It is important to note that the conclusions that lead to this procedure are derived in large part from the available data. Always take into account that the treatment will also be dependent on availability of resources and existing treatment guidelines, considered by other Pain Management Practitioners as being common knowledge and practice, at the time of the intervention. For Medico-Legal purposes, it is also important to point out that variation in procedural techniques and pharmacological choices are the acceptable norm. The indications, contraindications, technique, and results of the above procedure should only be interpreted and judged by a Board-Certified Interventional Pain Specialist with extensive familiarity and expertise in the same exact procedure and technique.

## 2018-01-05 NOTE — Progress Notes (Signed)
Safety precautions to be maintained throughout the outpatient stay will include: orient to surroundings, keep bed in low position, maintain call bell within reach at all times, provide assistance with transfer out of bed and ambulation.  

## 2018-01-05 NOTE — Patient Instructions (Signed)
____________________________________________________________________________________________  Post-Procedure Discharge Instructions  Instructions:  Apply ice: Fill a plastic sandwich bag with crushed ice. Cover it with a small towel and apply to injection site. Apply for 15 minutes then remove x 15 minutes. Repeat sequence on day of procedure, until you go to bed. The purpose is to minimize swelling and discomfort after procedure.  Apply heat: Apply heat to procedure site starting the day following the procedure. The purpose is to treat any soreness and discomfort from the procedure.  Food intake: Start with clear liquids (like water) and advance to regular food, as tolerated.   Physical activities: Keep activities to a minimum for the first 8 hours after the procedure.   Driving: If you have received any sedation, you are not allowed to drive for 24 hours after your procedure.  Blood thinner: Restart your blood thinner 6 hours after your procedure. (Only for those taking blood thinners)  Insulin: As soon as you can eat, you may resume your normal dosing schedule. (Only for those taking insulin)  Infection prevention: Keep procedure site clean and dry.  Post-procedure Pain Diary: Extremely important that this be done correctly and accurately. Recorded information will be used to determine the next step in treatment.  Pain evaluated is that of treated area only. Do not include pain from an untreated area.  Complete every hour, on the hour, for the initial 8 hours. Set an alarm to help you do this part accurately.  Do not go to sleep and have it completed later. It will not be accurate.  Follow-up appointment: Keep your follow-up appointment after the procedure. Usually 2 weeks for most procedures. (6 weeks in the case of radiofrequency.) Bring you pain diary.   Expect:  From numbing medicine (AKA: Local Anesthetics): Numbness or decrease in pain.  Onset: Full effect within 15  minutes of injected.  Duration: It will depend on the type of local anesthetic used. On the average, 1 to 8 hours.   From steroids: Decrease in swelling or inflammation. Once inflammation is improved, relief of the pain will follow.  Onset of benefits: Depends on the amount of swelling present. The more swelling, the longer it will take for the benefits to be seen. In some cases, up to 10 days.  Duration: Steroids will stay in the system x 2 weeks. Duration of benefits will depend on multiple posibilities including persistent irritating factors.  From procedure: Some discomfort is to be expected once the numbing medicine wears off. This should be minimal if ice and heat are applied as instructed.  Call if:  You experience numbness and weakness that gets worse with time, as opposed to wearing off.  New onset bowel or bladder incontinence. (This applies to Spinal procedures only)  Emergency Numbers:  Durning business hours (Monday - Thursday, 8:00 AM - 4:00 PM) (Friday, 9:00 AM - 12:00 Noon): [phone]  After hours: [phone] ____________________________________________________________________________________________   ____________________________________________________________________________________________  Preparing for Procedure with Sedation  Instructions: . Oral Intake: Do not eat or drink anything for at least 8 hours prior to your procedure. . Transportation: Public transportation is not allowed. Bring an adult driver. The driver must be physically present in our waiting room before any procedure can be started. Marland Kitchen Physical Assistance: Bring an adult physically capable of assisting you, in the event you need help. This adult should keep you company at home for at least 6 hours after the procedure. . Blood Pressure Medicine: Take your blood pressure medicine with a sip  of water the morning of the procedure. . Blood thinners: Notify our staff if you are taking any  blood thinners. Depending on which one you take, there will be specific instructions on how and when to stop it. . Diabetics on insulin: Notify the staff so that you can be scheduled 1st case in the morning. If your diabetes requires high dose insulin, take only  of your normal insulin dose the morning of the procedure and notify the staff that you have done so. . Preventing infections: Shower with an antibacterial soap the morning of your procedure. . Build-up your immune system: Take 1000 mg of Vitamin C with every meal (3 times a day) the day prior to your procedure. Marland Kitchen Antibiotics: Inform the staff if you have a condition or reason that requires you to take antibiotics before dental procedures. . Pregnancy: If you are pregnant, call and cancel the procedure. . Sickness: If you have a cold, fever, or any active infections, call and cancel the procedure. . Arrival: You must be in the facility at least 30 minutes prior to your scheduled procedure. . Children: Do not bring children with you. . Dress appropriately: Bring dark clothing that you would not mind if they get stained. . Valuables: Do not bring any jewelry or valuables.  Procedure appointments are reserved for interventional treatments only. Marland Kitchen No Prescription Refills. . No medication changes will be discussed during procedure appointments. . No disability issues will be discussed.  Reasons to call and reschedule or cancel your procedure: (Following these recommendations will minimize the risk of a serious complication.) . Surgeries: Avoid having procedures within 2 weeks of any surgery. (Avoid for 2 weeks before or after any surgery). . Flu Shots: Avoid having procedures within 2 weeks of a flu shots or . (Avoid for 2 weeks before or after immunizations). . Barium: Avoid having a procedure within 7-10 days after having had a radiological study involving the use of radiological contrast. (Myelograms, Barium swallow or enema  study). . Heart attacks: Avoid any elective procedures or surgeries for the initial 6 months after a "Myocardial Infarction" (Heart Attack). . Blood thinners: It is imperative that you stop these medications before procedures. Let us know if you if you take any blood thinner.  . Infection: Avoid procedures during or within two weeks of an infection (including chest colds or gastrointestinal problems). Symptoms associated with infections include: Localized redness, fever, chills, night sweats or profuse sweating, burning sensation when voiding, cough, congestion, stuffiness, runny nose, sore throat, diarrhea, nausea, vomiting, cold or Flu symptoms, recent or current infections. It is specially important if the infection is over the area that we intend to treat. Marland Kitchen Heart and lung problems: Symptoms that may suggest an active cardiopulmonary problem include: cough, chest pain, breathing difficulties or shortness of breath, dizziness, ankle swelling, uncontrolled high or unusually low blood pressure, and/or palpitations. If you are experiencing any of these symptoms, cancel your procedure and contact your primary care physician for an evaluation.  Remember:  Regular Business hours are:  Monday to Thursday 8:00 AM to 4:00 PM  Provider's Schedule: Milinda Pointer, MD:  Procedure days: Tuesday and Thursday 7:30 AM to 4:00 PM  Gillis Santa, MD:  Procedure days: Monday and Wednesday 7:30 AM to 4:00 PM ____________________________________________________________________________________________

## 2018-01-06 ENCOUNTER — Telehealth: Payer: Self-pay

## 2018-01-06 NOTE — Telephone Encounter (Signed)
Post procedure phone call. Patient states she is doing good.  

## 2018-01-09 NOTE — Progress Notes (Signed)
Results were reviewed and found to be: mildly abnormal  No acute injury or pathology identified  Review would suggest interventional pain management techniques may be of benefit 

## 2018-01-18 DIAGNOSIS — M5412 Radiculopathy, cervical region: Secondary | ICD-10-CM | POA: Insufficient documentation

## 2018-01-18 NOTE — Progress Notes (Signed)
Patient's Name: Sabrina Castro  MRN: [id number]  Referring Provider: Tracie Harrier, MD  DOB: January 11, 1961  PCP: Tracie Harrier, MD  DOS: 01/19/2018  Note by: Gaspar Cola, MD  Service setting: Ambulatory outpatient  Specialty: Interventional Pain Management  Patient type: Established  Location: [facility] (AMB) Pain Management Facility  Visit type: Interventional Procedure   Primary Reason for Visit: Interventional Pain Management Treatment. CC: Procedure (Cervical ESI)  Procedure:          Anesthesia, Analgesia, Anxiolysis:  Type: Diagnostic, Inter-Laminar, Epidural Steroid Injection #1  Region: Posterior Cervico-thoracic Region Level: C7-T1 Laterality: Right-Sided Paramedial  Type: Local Anesthesia Indication(s): Analgesia         Route: Infiltration (Zaleski/IM) IV Access: Declined Sedation: Declined  Local Anesthetic: Lidocaine 1-2%   Indications: 1. DDD (degenerative disc disease), cervical   2. Cervicalgia   3. Cervical radiculitis    Pain Score: Pre-procedure: 5 /10 Post-procedure: 0-No pain/10  Evaluation of last interventional procedure  01/05/2018 Procedure: Diagnostic bilateral lumbar facet block #2 under fluoroscopic guidance and IV sedation Pre-procedure pain score:  8/10 Post-procedure pain score: 0/10 (100% relief) Influential Factors: Intra-procedural challenges: None observed.         Reported side-effects: None.        Post-procedural adverse reactions or complications: None reported         Sedation: Sedation provided. When no sedatives are used, the analgesic levels obtained are directly associated to the effectiveness of the local anesthetics. However, when sedation is provided, the level of analgesia obtained during the initial 1 hour following the intervention, is believed to be the result of a combination of factors. These factors may include, but are not limited to: 1. The effectiveness of the local anesthetics used. 2. The effects of the analgesic(s)  and/or anxiolytic(s) used. 3. The degree of discomfort experienced by the patient at the time of the procedure. 4. The patients ability and reliability in recalling and recording the events. 5. The presence and influence of possible secondary gains and/or psychosocial factors. Reported result: Relief experienced during the 1st hour after the procedure: 100 % (Ultra-Short Term Relief)            Interpretative annotation: Clinically appropriate result. Analgesia during this period is likely to be Local Anesthetic and/or IV Sedative (Analgesic/Anxiolytic) related.          Effects of local anesthetic: The analgesic effects attained during this period are directly associated to the localized infiltration of local anesthetics and therefore cary significant diagnostic value as to the etiological location, or anatomical origin, of the pain. Expected duration of relief is directly dependent on the pharmacodynamics of the local anesthetic used. Long-acting (4-6 hours) anesthetics used.  Reported result: Relief during the next 4 to 6 hour after the procedure: 100 % (Short-Term Relief)            Interpretative annotation: Clinically appropriate result. Analgesia during this period is likely to be Local Anesthetic-related.          Long-term benefit: Defined as the period of time past the expected duration of local anesthetics (1 hour for short-acting and 4-6 hours for long-acting). With the possible exception of prolonged sympathetic blockade from the local anesthetics, benefits during this period are typically attributed to, or associated with, other factors such as analgesic sensory neuropraxia, antiinflammatory effects, or beneficial biochemical changes provided by agents other than the local anesthetics.  Reported result: Extended relief following procedure: 95 % (Long-Term Relief)  Interpretative annotation: Clinically appropriate result. Good relief. No permanent benefit expected. Inflammation  plays a part in the etiology to the pain.          Pre-op Assessment:  Ms. Bobst is a 57 y.o. (year old), female patient, seen today for interventional treatment. She  has a past surgical history that includes Colonoscopy with propofol (N/A, 01/09/2016); Esophagogastroduodenoscopy (egd) with propofol (N/A, 01/09/2016); and Cholecystectomy open. Ms. Trostle has a current medication list which includes the following prescription(s): amlodipine, duloxetine, ergocalciferol, ferrous sulfate, gabapentin, metformin, metoprolol succinate, multivitamin, olmesartan-hydrochlorothiazide, pantoprazole, trazodone, and tramadol. Her primarily concern today is the Procedure (Cervical ESI)  Initial Vital Signs:  Pulse/HCG Rate: 72ECG Heart Rate: 63 Temp: 98.2 F (36.8 C) Resp: 18 BP: 125/63 SpO2: 100 %  BMI: Estimated body mass index is 57.5 kg/m as calculated from the following:   Height as of this encounter: 5\' 4"  (1.626 m).   Weight as of this encounter: 335 lb (152 kg).  Risk Assessment: Allergies: Reviewed. She is allergic to lisinopril.  Allergy Precautions: None required Coagulopathies: Reviewed. None identified.  Blood-thinner therapy: None at this time Active Infection(s): Reviewed. None identified. Ms. Garlitz is afebrile  Site Confirmation: Ms. Dantuono was asked to confirm the procedure and laterality before marking the site Procedure checklist: Completed Consent: Before the procedure and under the influence of no sedative(s), amnesic(s), or anxiolytics, the patient was informed of the treatment options, risks and possible complications. To fulfill our ethical and legal obligations, as recommended by the American Medical Association's Code of Ethics, I have informed the patient of my clinical impression; the nature and purpose of the treatment or procedure; the risks, benefits, and possible complications of the intervention; the alternatives, including doing nothing; the risk(s) and benefit(s) of  the alternative treatment(s) or procedure(s); and the risk(s) and benefit(s) of doing nothing. The patient was provided information about the general risks and possible complications associated with the procedure. These may include, but are not limited to: failure to achieve desired goals, infection, bleeding, organ or nerve damage, allergic reactions, paralysis, and death. In addition, the patient was informed of those risks and complications associated to Spine-related procedures, such as failure to decrease pain; infection (i.e.: Meningitis, epidural or intraspinal abscess); bleeding (i.e.: epidural hematoma, subarachnoid hemorrhage, or any other type of intraspinal or peri-dural bleeding); organ or nerve damage (i.e.: Any type of peripheral nerve, nerve root, or spinal cord injury) with subsequent damage to sensory, motor, and/or autonomic systems, resulting in permanent pain, numbness, and/or weakness of one or several areas of the body; allergic reactions; (i.e.: anaphylactic reaction); and/or death. Furthermore, the patient was informed of those risks and complications associated with the medications. These include, but are not limited to: allergic reactions (i.e.: anaphylactic or anaphylactoid reaction(s)); adrenal axis suppression; blood sugar elevation that in diabetics may result in ketoacidosis or comma; water retention that in patients with history of congestive heart failure may result in shortness of breath, pulmonary edema, and decompensation with resultant heart failure; weight gain; swelling or edema; medication-induced neural toxicity; particulate matter embolism and blood vessel occlusion with resultant organ, and/or nervous system infarction; and/or aseptic necrosis of one or more joints. Finally, the patient was informed that Medicine is not an exact science; therefore, there is also the possibility of unforeseen or unpredictable risks and/or possible complications that may result in a  catastrophic outcome. The patient indicated having understood very clearly. We have given the patient no guarantees and we have made no promises. Enough time  was given to the patient to ask questions, all of which were answered to the patient's satisfaction. Ms. Monceaux has indicated that she wanted to continue with the procedure. Attestation: I, the ordering provider, attest that I have discussed with the patient the benefits, risks, side-effects, alternatives, likelihood of achieving goals, and potential problems during recovery for the procedure that I have provided informed consent. Date  Time: 01/19/2018  8:53 AM  Pre-Procedure Preparation:  Monitoring: As per clinic protocol. Respiration, ETCO2, SpO2, BP, heart rate and rhythm monitor placed and checked for adequate function Safety Precautions: Patient was assessed for positional comfort and pressure points before starting the procedure. Time-out: I initiated and conducted the "Time-out" before starting the procedure, as per protocol. The patient was asked to participate by confirming the accuracy of the "Time Out" information. Verification of the correct person, site, and procedure were performed and confirmed by me, the nursing staff, and the patient. "Time-out" conducted as per Joint Commission's Universal Protocol (UP.01.01.01). Time: 0935  Description of Procedure:          Position: Prone with head of the table was raised to facilitate breathing. Target Area: For Epidural Steroid injections the target is the interlaminar space, initially targeting the lower border of the superior vertebral body lamina. Approach: Paramedial approach. Area Prepped: Entire PosteriorCervical Region Prepping solution: ChloraPrep (2% chlorhexidine gluconate and 70% isopropyl alcohol) Safety Precautions: Aspiration looking for blood return was conducted prior to all injections. At no point did we inject any substances, as a needle was being advanced. No attempts  were made at seeking any paresthesias. Safe injection practices and needle disposal techniques used. Medications properly checked for expiration dates. SDV (single dose vial) medications used. Description of the Procedure: Protocol guidelines were followed. The procedure needle was introduced through the skin, ipsilateral to the reported pain, and advanced to the target area. Bone was contacted and the needle walked caudad, until the lamina was cleared. The epidural space was identified using "loss-of-resistance technique" with 2-3 ml of PF-NaCl (0.9% NSS), in a 5cc LOR glass syringe. Vitals:   01/19/18 0929 01/19/18 0934 01/19/18 0939 01/19/18 0942  BP: (!) 130/91 133/88 128/84 125/86  Pulse:      Resp: (!) 22 (!) 22 20 15   Temp:      SpO2: 98% 100% 98% 98%  Weight:      Height:        Start Time: 0935 hrs. End Time: 0940 hrs. Materials:  Needle(s) Type: Epidural needle Gauge: 17G Length: 3.5-in Medication(s): Please see orders for medications and dosing details.  Imaging Guidance (Spinal):          Type of Imaging Technique: Fluoroscopy Guidance (Spinal) Indication(s): Assistance in needle guidance and placement for procedures requiring needle placement in or near specific anatomical locations not easily accessible without such assistance. Exposure Time: Please see nurses notes. Contrast: Before injecting any contrast, we confirmed that the patient did not have an allergy to iodine, shellfish, or radiological contrast. Once satisfactory needle placement was completed at the desired level, radiological contrast was injected. Contrast injected under live fluoroscopy. No contrast complications. See chart for type and volume of contrast used. Fluoroscopic Guidance: I was personally present during the use of fluoroscopy. "Tunnel Vision Technique" used to obtain the best possible view of the target area. Parallax error corrected before commencing the procedure. "Direction-depth-direction"  technique used to introduce the needle under continuous pulsed fluoroscopy. Once target was reached, antero-posterior, oblique, and lateral fluoroscopic projection used confirm needle placement  in all planes. Images permanently stored in EMR. Interpretation: I personally interpreted the imaging intraoperatively. Adequate needle placement confirmed in multiple planes. Appropriate spread of contrast into desired area was observed. No evidence of afferent or efferent intravascular uptake. No intrathecal or subarachnoid spread observed. Permanent images saved into the patient's record.  Antibiotic Prophylaxis:   Anti-infectives (From admission, onward)   None     Indication(s): None identified  Post-operative Assessment:  Post-procedure Vital Signs:  Pulse/HCG Rate: 7266 Temp: 98.2 F (36.8 C) Resp: 15 BP: 125/86 SpO2: 98 %  EBL: None  Complications: No immediate post-treatment complications observed by team, or reported by patient.  Note: The patient tolerated the entire procedure well. A repeat set of vitals were taken after the procedure and the patient was kept under observation following institutional policy, for this type of procedure. Post-procedural neurological assessment was performed, showing return to baseline, prior to discharge. The patient was provided with post-procedure discharge instructions, including a section on how to identify potential problems. Should any problems arise concerning this procedure, the patient was given instructions to immediately contact us, at any time, without hesitation. In any case, we plan to contact the patient by telephone for a follow-up status report regarding this interventional procedure.  Comments:  No additional relevant information.  Plan of Care    Imaging Orders     DG C-Arm 1-60 Min-No Report  Procedure Orders     Cervical Epidural Injection  Medications ordered for procedure: Meds ordered this encounter  Medications  .  iopamidol (ISOVUE-M) 41 % intrathecal injection 10 mL    Must be Myelogram-compatible. If not available, you may substitute with a water-soluble, non-ionic, hypoallergenic, myelogram-compatible radiological contrast medium.  Marland Kitchen lidocaine (XYLOCAINE) 2 % (with pres) injection 400 mg  . DISCONTD: midazolam (VERSED) 5 MG/5ML injection 1-2 mg    Make sure Flumazenil is available in the pyxis when using this medication. If oversedation occurs, administer 0.2 mg IV over 15 sec. If after 45 sec no response, administer 0.2 mg again over 1 min; may repeat at 1 min intervals; not to exceed 4 doses (1 mg)  . DISCONTD: fentaNYL (SUBLIMAZE) injection 25-50 mcg    Make sure Narcan is available in the pyxis when using this medication. In the event of respiratory depression (RR< 8/min): Titrate NARCAN (naloxone) in increments of 0.1 to 0.2 mg IV at 2-3 minute intervals, until desired degree of reversal.  . DISCONTD: lactated ringers infusion 1,000 mL  . sodium chloride flush (NS) 0.9 % injection 1 mL  . ropivacaine (PF) 2 mg/mL (0.2%) (NAROPIN) injection 1 mL  . dexamethasone (DECADRON) injection 10 mg   Medications administered: We administered iopamidol, lidocaine, sodium chloride flush, ropivacaine (PF) 2 mg/mL (0.2%), and dexamethasone.  See the medical record for exact dosing, route, and time of administration.  New Prescriptions   No medications on file   Disposition: Discharge home  Discharge Date & Time: 01/19/2018; 0948 hrs.   Physician-requested Follow-up: Return for post-procedure eval (2 wks), w/ Dr. Dossie Arbour.  Future Appointments  Date Time Provider [place]  02/08/2018 11:15 AM Milinda Pointer, MD [facility] None   Primary Care Physician: Tracie Harrier, MD Location: [facility] Note by: Gaspar Cola, MD Date: 01/19/2018; Time: 9:55 AM  Disclaimer:  Medicine is not an exact science. The only guarantee in medicine is that nothing is  guaranteed. It is important to note that the decision to proceed with this intervention was based on the information collected  from the patient. The Data and conclusions were drawn from the patient's questionnaire, the interview, and the physical examination. Because the information was provided in large part by the patient, it cannot be guaranteed that it has not been purposely or unconsciously manipulated. Every effort has been made to obtain as much relevant data as possible for this evaluation. It is important to note that the conclusions that lead to this procedure are derived in large part from the available data. Always take into account that the treatment will also be dependent on availability of resources and existing treatment guidelines, considered by other Pain Management Practitioners as being common knowledge and practice, at the time of the intervention. For Medico-Legal purposes, it is also important to point out that variation in procedural techniques and pharmacological choices are the acceptable norm. The indications, contraindications, technique, and results of the above procedure should only be interpreted and judged by a Board-Certified Interventional Pain Specialist with extensive familiarity and expertise in the same exact procedure and technique.

## 2018-01-19 ENCOUNTER — Encounter: Payer: Self-pay | Admitting: Pain Medicine

## 2018-01-19 ENCOUNTER — Other Ambulatory Visit: Payer: Self-pay

## 2018-01-19 ENCOUNTER — Ambulatory Visit
Admission: RE | Admit: 2018-01-19 | Discharge: 2018-01-19 | Disposition: A | Discharge status: Home / Self Care | Payer: BLUE CROSS/BLUE SHIELD | Source: Ambulatory Visit | Attending: Pain Medicine | Admitting: Pain Medicine

## 2018-01-19 ENCOUNTER — Ambulatory Visit (HOSPITAL_BASED_OUTPATIENT_CLINIC_OR_DEPARTMENT_OTHER): Payer: BLUE CROSS/BLUE SHIELD | Admitting: Pain Medicine

## 2018-01-19 VITALS — BP 125/86 | HR 72 | Temp 98.2°F | Resp 15 | Ht 64.0 in | Wt 335.0 lb

## 2018-01-19 DIAGNOSIS — M5010 Cervical disc disorder with radiculopathy, unspecified cervical region: Secondary | ICD-10-CM | POA: Diagnosis present

## 2018-01-19 DIAGNOSIS — M5030 Other cervical disc degeneration, unspecified cervical region: Secondary | ICD-10-CM | POA: Diagnosis not present

## 2018-01-19 DIAGNOSIS — M542 Cervicalgia: Secondary | ICD-10-CM

## 2018-01-19 DIAGNOSIS — M5412 Radiculopathy, cervical region: Secondary | ICD-10-CM

## 2018-01-19 MED ORDER — DEXAMETHASONE SODIUM PHOSPHATE 10 MG/ML IJ SOLN
10.0000 mg | Freq: Once | INTRAMUSCULAR | Status: AC
  Administered 2018-01-19: 10 mg
  Filled 2018-01-19: qty 1

## 2018-01-19 MED ORDER — LACTATED RINGERS IV SOLN: 1000.0000 mL | Freq: Once | INTRAVENOUS | Status: DC

## 2018-01-19 MED ORDER — SODIUM CHLORIDE 0.9% FLUSH
1.0000 mL | Freq: Once | INTRAVENOUS | Status: AC
  Administered 2018-01-19: 1 mL

## 2018-01-19 MED ORDER — IOPAMIDOL (ISOVUE-M 200) INJECTION 41%
10.0000 mL | Freq: Once | INTRAMUSCULAR | Status: AC
  Administered 2018-01-19: 10 mL via EPIDURAL
  Filled 2018-01-19: qty 10

## 2018-01-19 MED ORDER — SODIUM CHLORIDE 0.9 % IJ SOLN
INTRAMUSCULAR | Status: AC
  Filled 2018-01-19: qty 10

## 2018-01-19 MED ORDER — LIDOCAINE HCL 2 % IJ SOLN
20.0000 mL | Freq: Once | INTRAMUSCULAR | Status: AC
  Administered 2018-01-19: 400 mg
  Filled 2018-01-19: qty 40

## 2018-01-19 MED ORDER — ROPIVACAINE HCL 2 MG/ML IJ SOLN
1.0000 mL | Freq: Once | INTRAMUSCULAR | Status: AC
  Administered 2018-01-19: 1 mL via EPIDURAL
  Filled 2018-01-19: qty 10

## 2018-01-19 MED ORDER — MIDAZOLAM HCL 5 MG/5ML IJ SOLN: 1.0000 mg | INTRAMUSCULAR | Status: DC | PRN

## 2018-01-19 MED ORDER — FENTANYL CITRATE (PF) 100 MCG/2ML IJ SOLN: 25.0000 ug | INTRAMUSCULAR | Status: DC | PRN

## 2018-01-19 NOTE — Patient Instructions (Addendum)
____________________________________________________________________________________________  Post-Procedure Discharge Instructions  Instructions:  Apply ice: Fill a plastic sandwich bag with crushed ice. Cover it with a small towel and apply to injection site. Apply for 15 minutes then remove x 15 minutes. Repeat sequence on day of procedure, until you go to bed. The purpose is to minimize swelling and discomfort after procedure.  Apply heat: Apply heat to procedure site starting the day following the procedure. The purpose is to treat any soreness and discomfort from the procedure.  Food intake: Start with clear liquids (like water) and advance to regular food, as tolerated.   Physical activities: Keep activities to a minimum for the first 8 hours after the procedure.   Driving: If you have received any sedation, you are not allowed to drive for 24 hours after your procedure.  Blood thinner: Restart your blood thinner 6 hours after your procedure. (Only for those taking blood thinners)  Insulin: As soon as you can eat, you may resume your normal dosing schedule. (Only for those taking insulin)  Infection prevention: Keep procedure site clean and dry.  Post-procedure Pain Diary: Extremely important that this be done correctly and accurately. Recorded information will be used to determine the next step in treatment.  Pain evaluated is that of treated area only. Do not include pain from an untreated area.  Complete every hour, on the hour, for the initial 8 hours. Set an alarm to help you do this part accurately.  Do not go to sleep and have it completed later. It will not be accurate.  Follow-up appointment: Keep your follow-up appointment after the procedure. Usually 2 weeks for most procedures. (6 weeks in the case of radiofrequency.) Bring you pain diary.   Expect:  From numbing medicine (AKA: Local Anesthetics): Numbness or decrease in pain.  Onset: Full effect within 15  minutes of injected.  Duration: It will depend on the type of local anesthetic used. On the average, 1 to 8 hours.   From steroids: Decrease in swelling or inflammation. Once inflammation is improved, relief of the pain will follow.  Onset of benefits: Depends on the amount of swelling present. The more swelling, the longer it will take for the benefits to be seen. In some cases, up to 10 days.  Duration: Steroids will stay in the system x 2 weeks. Duration of benefits will depend on multiple posibilities including persistent irritating factors.  From procedure: Some discomfort is to be expected once the numbing medicine wears off. This should be minimal if ice and heat are applied as instructed.  Call if:  You experience numbness and weakness that gets worse with time, as opposed to wearing off.  New onset bowel or bladder incontinence. (This applies to Spinal procedures only)  Emergency Numbers:  Durning business hours (Monday - Thursday, 8:00 AM - 4:00 PM) (Friday, 9:00 AM - 12:00 Noon): [phone]  After hours: [phone] ____________________________________________________________________________________________  ____________________________________________________________________________________________  Pain Scale  Introduction: The pain score used by this practice is the Verbal Numerical Rating Scale (VNRS-11). This is an 11-point scale. It is for adults and children 10 years or older. There are significant differences in how the pain score is reported, used, and applied. Forget everything you learned in the past and learn this scoring system.  General Information: The scale should reflect your current level of pain. Unless you are specifically asked for the level of your worst pain, or your average pain. If you are asked for one of these two, then it  should be understood that it is over the past 24 hours.  Basic Activities of Daily Living (ADL): Personal hygiene,  dressing, eating, transferring, and using restroom.  Instructions: Most patients tend to report their level of pain as a combination of two factors, their physical pain and their psychosocial pain. This last one is also known as "suffering" and it is reflection of how physical pain affects you socially and psychologically. From now on, report them separately. From this point on, when asked to report your pain level, report only your physical pain. Use the following table for reference.  Pain Clinic Pain Levels (0-5/10)  Pain Level Score  Description  No Pain 0   Mild pain 1 Nagging, annoying, but does not interfere with basic activities of daily living (ADL). Patients are able to eat, bathe, get dressed, toileting (being able to get on and off the toilet and perform personal hygiene functions), transfer (move in and out of bed or a chair without assistance), and maintain continence (able to control bladder and bowel functions). Blood pressure and heart rate are unaffected. A normal heart rate for a healthy adult ranges from 60 to 100 bpm (beats per minute).   Mild to moderate pain 2 Noticeable and distracting. Impossible to hide from other people. More frequent flare-ups. Still possible to adapt and function close to normal. It can be very annoying and may have occasional stronger flare-ups. With discipline, patients may get used to it and adapt.   Moderate pain 3 Interferes significantly with activities of daily living (ADL). It becomes difficult to feed, bathe, get dressed, get on and off the toilet or to perform personal hygiene functions. Difficult to get in and out of bed or a chair without assistance. Very distracting. With effort, it can be ignored when deeply involved in activities.   Moderately severe pain 4 Impossible to ignore for more than a few minutes. With effort, patients may still be able to manage work or participate in some social activities. Very difficult to concentrate. Signs of  autonomic nervous system discharge are evident: dilated pupils (mydriasis); mild sweating (diaphoresis); sleep interference. Heart rate becomes elevated (>115 bpm). Diastolic blood pressure (lower number) rises above 100 mmHg. Patients find relief in laying down and not moving.   Severe pain 5 Intense and extremely unpleasant. Associated with frowning face and frequent crying. Pain overwhelms the senses.  Ability to do any activity or maintain social relationships becomes significantly limited. Conversation becomes difficult. Pacing back and forth is common, as getting into a comfortable position is nearly impossible. Pain wakes you up from deep sleep. Physical signs will be obvious: pupillary dilation; increased sweating; goosebumps; brisk reflexes; cold, clammy hands and feet; nausea, vomiting or dry heaves; loss of appetite; significant sleep disturbance with inability to fall asleep or to remain asleep. When persistent, significant weight loss is observed due to the complete loss of appetite and sleep deprivation.  Blood pressure and heart rate becomes significantly elevated. Caution: If elevated blood pressure triggers a pounding headache associated with blurred vision, then the patient should immediately seek attention at an urgent or emergency care unit, as these may be signs of an impending stroke.    Emergency Department Pain Levels (6-10/10)  Emergency Room Pain 6 Severely limiting. Requires emergency care and should not be seen or managed at an outpatient pain management facility. Communication becomes difficult and requires great effort. Assistance to reach the emergency department may be required. Facial flushing and profuse sweating along with potentially dangerous  increases in heart rate and blood pressure will be evident.   Distressing pain 7 Self-care is very difficult. Assistance is required to transport, or use restroom. Assistance to reach the emergency department will be required. Tasks  requiring coordination, such as bathing and getting dressed become very difficult.   Disabling pain 8 Self-care is no longer possible. At this level, pain is disabling. The individual is unable to do even the most "basic" activities such as walking, eating, bathing, dressing, transferring to a bed, or toileting. Fine motor skills are lost. It is difficult to think clearly.   Incapacitating pain 9 Pain becomes incapacitating. Thought processing is no longer possible. Difficult to remember your own name. Control of movement and coordination are lost.   The worst pain imaginable 10 At this level, most patients pass out from pain. When this level is reached, collapse of the autonomic nervous system occurs, leading to a sudden drop in blood pressure and heart rate. This in turn results in a temporary and dramatic drop in blood flow to the brain, leading to a loss of consciousness. Fainting is one of the body's self defense mechanisms. Passing out puts the brain in a calmed state and causes it to shut down for a while, in order to begin the healing process.    Summary: 1. Refer to this scale when providing [place] with your pain level. 2. Be accurate and careful when reporting your pain level. This will help with your care. 3. Over-reporting your pain level will lead to loss of credibility. 4. Even a level of 1/10 means that there is pain and will be treated at our facility. 5. High, inaccurate reporting will be documented as "Symptom Exaggeration", leading to loss of credibility and suspicions of possible secondary gains such as obtaining more narcotics, or wanting to appear disabled, for fraudulent reasons. 6. Only pain levels of 5 or below will be seen at our facility. 7. Pain levels of 6 and above will be sent to the Emergency Department and the appointment cancelled. ____________________________________________________________________________________________

## 2018-01-19 NOTE — Progress Notes (Signed)
Safety precautions to be maintained throughout the outpatient stay will include: orient to surroundings, keep bed in low position, maintain call bell within reach at all times, provide assistance with transfer out of bed and ambulation.  

## 2018-01-20 ENCOUNTER — Telehealth: Payer: Self-pay

## 2018-01-20 NOTE — Telephone Encounter (Signed)
Post procedure phone call.  Patient state she had some spasms in her fingers yesterday, but it has subsided today.  Instructed her to call us for any further questions or concerns.

## 2018-02-07 NOTE — Progress Notes (Signed)
Patient's Name: Sabrina Castro  MRN: [id number]  Referring Provider: Tracie Harrier, MD  DOB: Mar 13, 1961  PCP: Tracie Harrier, MD  DOS: 02/08/2018  Note by: Gaspar Cola, MD  Service setting: Ambulatory outpatient  Specialty: Interventional Pain Management  Location: [facility] (AMB) Pain Management Facility    Patient type: Established   Primary Reason(s) for Visit: Encounter for post-procedure evaluation of chronic illness with mild to moderate exacerbation CC: Shoulder Pain (right)  HPI  Sabrina Castro is a 57 y.o. year old, female patient, who comes today for a post-procedure evaluation. She has Acute maxillary sinusitis, unspecified; Allergic rhinitis; Anemia; DDD (degenerative disc disease), lumbar; Depression; Elevated erythrocyte sedimentation rate; Encounter for other general examination; Encounter for screening mammogram for malignant neoplasm of breast; Essential (primary) hypertension; Generalized osteoarthritis of multiple sites; GERD (gastroesophageal reflux disease); History of tachycardia; Morbid obesity with BMI of 50.0-59.9, adult ([place]); Obesity, unspecified; Numbness and tingling of foot; Obstructive sleep apnea; Other fecal abnormalities; Other general symptoms and signs; Other specified anxiety disorders; Pain in right knee; Polyneuropathy; Prediabetes; Tachycardia; Type 2 diabetes mellitus without complication, without long-term current use of insulin ([place]); Vitamin D deficiency, unspecified; Morbid obesity with BMI of 40.0-44.9, adult ([place]); Chronic low back pain (Primary Area of Pain) (Bilateral) (Midline) (L>R) w/ sciatica (Bilateral); Chronic hip pain (Secondary Area of Pain) (Bilateral) (L>R); Chronic lower extremity pain (Tertiary Area of Pain) (Bilateral) (L>R); Chronic knee pain (Fourth Area of Pain) (Bilateral) (R>L); Chronic pain syndrome; Long term current use of opiate analgesic; Pharmacologic therapy; Disorder of skeletal system; Problems influencing health status;  Chronic sacroiliac joint pain; Anterolisthesis (L2-3 and L4-5); Lumbar facet arthropathy (L2-3, L3-4, L4-5) (Bilateral); Lumbar foraminal stenosis (Right: L2-3) (Bilateral: L4-5); Lumbar lateral recess stenosis (Right: L2-3) (Bilateral: L4-5); Abnormal MRI, lumbar spine; Osteoarthritis of hips (Bilateral); Osteoarthritis of knees (Bilateral); Spondylosis without myelopathy or radiculopathy, lumbosacral region; Other specified dorsopathies, sacral and sacrococcygeal region; Elevated C-reactive protein (CRP); Neurogenic pain; Lumbar spine instability; Lumbar facet syndrome (Bilateral) (L>R); Chronic low back pain (Primary Area of Pain) (Bilateral) (L>R) w/o sciatica; Cervicalgia; Myofascial pain; Abnormal x-ray of cervical spine; DDD (degenerative disc disease), cervical; and Cervical radiculitis on their problem list. Her primarily concern today is the Shoulder Pain (right)  Pain Assessment: Location: Right Shoulder Radiating: Pain radiaties down right arm to elbow, tingling in her hands Onset: More than a month ago Duration: Chronic pain Quality: Constant, Tingling, Discomfort, Throbbing Severity: 1 /10 (subjective, self-reported pain score)  Note: Reported level is compatible with observation.                         When using our objective Pain Scale, levels between 6 and 10/10 are said to belong in an emergency room, as it progressively worsens from a 6/10, described as severely limiting, requiring emergency care not usually available at an outpatient pain management facility. At a 6/10 level, communication becomes difficult and requires great effort. Assistance to reach the emergency department may be required. Facial flushing and profuse sweating along with potentially dangerous increases in heart rate and blood pressure will be evident. Effect on ADL: no prolonged sitting, shoulder gets tight, hard to turn her head. Timing: Constant Modifying factors: medications,  BP: (!) 110/51  HR: 68  [name] comes in today for post-procedure evaluation after the treatment done on 01/20/2018.  Further details on both, my assessment(s), as well as the proposed treatment plan, please see below.  Post-Procedure Assessment  01/19/2018 Procedure: Diagnostic right-sided C7-T1  cervical epidural steroid injection #1 under fluoroscopic guidance, no sedation Pre-procedure pain score:  5/10 Post-procedure pain score: 0/10 (100% relief) Influential Factors: BMI: 56.64 kg/m Intra-procedural challenges: None observed.         Assessment challenges: None detected.              Reported side-effects: None.        Post-procedural adverse reactions or complications: None reported         Sedation: No sedation used. When no sedatives are used, the analgesic levels obtained are directly associated to the effectiveness of the local anesthetics. However, when sedation is provided, the level of analgesia obtained during the initial 1 hour following the intervention, is believed to be the result of a combination of factors. These factors may include, but are not limited to: 1. The effectiveness of the local anesthetics used. 2. The effects of the analgesic(s) and/or anxiolytic(s) used. 3. The degree of discomfort experienced by the patient at the time of the procedure. 4. The patients ability and reliability in recalling and recording the events. 5. The presence and influence of possible secondary gains and/or psychosocial factors. Reported result: Relief experienced during the 1st hour after the procedure: 100 % (Ultra-Short Term Relief)            Interpretative annotation: Clinically appropriate result. No IV Analgesic or Anxiolytic given, therefore benefits are completely due to Local Anesthetic effects.          Effects of local anesthetic: The analgesic effects attained during this period are directly associated to the localized infiltration of local anesthetics and therefore cary significant diagnostic value  as to the etiological location, or anatomical origin, of the pain. Expected duration of relief is directly dependent on the pharmacodynamics of the local anesthetic used. Long-acting (4-6 hours) anesthetics used.  Reported result: Relief during the next 4 to 6 hour after the procedure: 100 %(100 that last for about 8 days) (Short-Term Relief)            Interpretative annotation: Clinically appropriate result. Analgesia during this period is likely to be Local Anesthetic-related.          Long-term benefit: Defined as the period of time past the expected duration of local anesthetics (1 hour for short-acting and 4-6 hours for long-acting). With the possible exception of prolonged sympathetic blockade from the local anesthetics, benefits during this period are typically attributed to, or associated with, other factors such as analgesic sensory neuropraxia, antiinflammatory effects, or beneficial biochemical changes provided by agents other than the local anesthetics.  Reported result: Extended relief following procedure: 80 % (Long-Term Relief)            Interpretative annotation: Clinically possible results. Good relief. No permanent benefit expected. Inflammation plays a part in the etiology to the pain.          Current benefits: Defined as reported results that persistent at this point in time.   Analgesia: >50 % Sabrina Castro reports improvement of axial and dermatomal symptoms. Function: Somewhat improved ROM: Somewhat improved Interpretative annotation: Recurrence of symptoms. Therapeutic benefit observed. Effective therapeutic approach. Benefit could be steroid-related.  Interpretation: Results would suggest a successful diagnostic intervention.                  Plan:  Proceed with treatment No.: 2          Laboratory Chemistry  Inflammation Markers (CRP: Acute Phase) (ESR: Chronic Phase) Lab Results  Component Value Date  CRP 28.4 (H) 09/13/2017   ESRSEDRATE 50 (H) 09/13/2017                          Renal Markers Lab Results  Component Value Date   BUN 8 09/13/2017   CREATININE 0.90 09/13/2017   BCR 9 09/13/2017   GFRAA 83 09/13/2017   GFRNONAA 72 09/13/2017                             Hepatic Markers Lab Results  Component Value Date   AST 14 09/13/2017   ALBUMIN 4.2 09/13/2017                        Neuropathy Markers Lab Results  Component Value Date   VITAMINB12 >2000 (H) 09/13/2017                        Hematology Parameters Lab Results  Component Value Date   PLT 410 04/28/2017   HGB 11.7 (L) 04/28/2017   HCT 35.9 04/28/2017                        CV Markers Lab Results  Component Value Date   TROPONINI <0.03 04/28/2017                         Note: Lab results reviewed.  Recent Imaging Results   Results for orders placed in visit on 01/19/18  DG C-Arm 1-60 Min-No Report   Narrative Fluoroscopy was utilized by the requesting physician.  No radiographic  interpretation.    Interpretation Report: Fluoroscopy was used during the procedure to assist with needle guidance. The images were interpreted intraoperatively by the requesting physician.  Meds   Current Outpatient Medications:  .  amLODipine (NORVASC) 5 MG tablet, Take 5 mg by mouth daily., Disp: , Rfl:  .  DULoxetine (CYMBALTA) 60 MG capsule, Take 60 mg by mouth daily., Disp: , Rfl:  .  ergocalciferol (VITAMIN D2) 50000 units capsule, Take 50,000 Units by mouth once a week., Disp: , Rfl:  .  ferrous sulfate 325 (65 FE) MG tablet, Take 325 mg by mouth daily with breakfast., Disp: , Rfl:  .  metFORMIN (GLUCOPHAGE) 500 MG tablet, Take 500 mg by mouth daily., Disp: , Rfl:  .  metoprolol succinate (TOPROL-XL) 50 MG 24 hr tablet, Take 50 mg by mouth daily. Take with or immediately following a meal., Disp: , Rfl:  .  Multiple Vitamin (MULTIVITAMIN) tablet, Take 1 tablet by mouth daily., Disp: , Rfl:  .  olmesartan-hydrochlorothiazide (BENICAR HCT) 20-12.5 MG tablet, Take 1 tablet by  mouth daily., Disp: , Rfl:  .  pantoprazole (PROTONIX) 40 MG tablet, Take 40 mg by mouth daily., Disp: , Rfl:  .  traZODone (DESYREL) 50 MG tablet, Take 50 mg by mouth at bedtime., Disp: , Rfl:  .  gabapentin (NEURONTIN) 300 MG capsule, Take 1-3 capsules (300-900 mg total) by mouth 4 (four) times daily. Follow the written titration schedule., Disp: 360 capsule, Rfl: 2 .  traMADol (ULTRAM) 50 MG tablet, Take 1-2 tablets (50-100 mg total) by mouth every 6 (six) hours as needed for severe pain., Disp: 240 tablet, Rfl: 0  ROS  Constitutional: Denies any fever or chills Gastrointestinal: No reported hemesis, hematochezia, vomiting, or acute GI distress Musculoskeletal: Denies any acute  onset joint swelling, redness, loss of ROM, or weakness Neurological: No reported episodes of acute onset apraxia, aphasia, dysarthria, agnosia, amnesia, paralysis, loss of coordination, or loss of consciousness  Allergies  Sabrina Castro is allergic to lisinopril.  [place]  Drug: Sabrina Castro  reports that she does not use drugs. Alcohol:  reports that she does not drink alcohol. Tobacco:  reports that she has never smoked. She has never used smokeless tobacco. Medical:  has a past medical history of Depression, GERD (gastroesophageal reflux disease), and Hypertension. Surgical: Sabrina Castro  has a past surgical history that includes Colonoscopy with propofol (N/A, 01/09/2016); Esophagogastroduodenoscopy (egd) with propofol (N/A, 01/09/2016); and Cholecystectomy open. Family: family history includes Breast cancer in her maternal aunt.  Constitutional Exam  General appearance: Well nourished, well developed, and well hydrated. In no apparent acute distress Vitals:   02/08/18 0908  BP: (!) 110/51  Pulse: 68  Temp: 97.8 F (36.6 C)  SpO2: 100%  Weight: (!) 330 lb (149.7 kg)  Height: _0  (1.626 m)   BMI Assessment: Estimated body mass index is 56.64 kg/m as calculated from the following:   Height as of this  encounter: _1  (1.626 m).   Weight as of this encounter: 330 lb (149.7 kg).  BMI interpretation table: BMI level Category Range association with higher incidence of chronic pain  <18 kg/m2 Underweight   18.5-24.9 kg/m2 Ideal body weight   25-29.9 kg/m2 Overweight Increased incidence by 20%  30-34.9 kg/m2 Obese (Class I) Increased incidence by 68%  35-39.9 kg/m2 Severe obesity (Class II) Increased incidence by 136%  >40 kg/m2 Extreme obesity (Class III) Increased incidence by 254%   Patient's current BMI Ideal Body weight  Body mass index is 56.64 kg/m. Ideal body weight: 54.7 kg (120 lb 9.5 oz) Adjusted ideal body weight: 92.7 kg (204 lb 5.7 oz)   BMI Readings from Last 4 Encounters:  02/08/18 56.64 kg/m  01/19/18 57.50 kg/m  01/05/18 57.85 kg/m  01/03/18 57.67 kg/m   Wt Readings from Last 4 Encounters:  02/08/18 (!) 330 lb (149.7 kg)  01/19/18 (!) 335 lb (152 kg)  01/05/18 (!) 337 lb (152.9 kg)  01/03/18 (!) 336 lb (152.4 kg)  Psych/Mental status: Alert, oriented x 3 (person, place, & time)       Eyes: PERLA Respiratory: No evidence of acute respiratory distress  Cervical Spine Area Exam  Skin & Axial Inspection: No masses, redness, edema, swelling, or associated skin lesions Alignment: Symmetrical Functional ROM: Decreased ROM      Stability: No instability detected Muscle Tone/Strength: Functionally intact. No obvious neuro-muscular anomalies detected. Sensory (Neurological): Unimpaired Palpation: No palpable anomalies              Upper Extremity (UE) Exam    Side: Right upper extremity  Side: Left upper extremity  Skin & Extremity Inspection: Skin color, temperature, and hair growth are WNL. No peripheral edema or cyanosis. No masses, redness, swelling, asymmetry, or associated skin lesions. No contractures.  Skin & Extremity Inspection: Skin color, temperature, and hair growth are WNL. No peripheral edema or cyanosis. No masses, redness, swelling, asymmetry, or  associated skin lesions. No contractures.  Functional ROM: Unrestricted ROM          Functional ROM: Unrestricted ROM          Muscle Tone/Strength: Functionally intact. No obvious neuro-muscular anomalies detected.  Muscle Tone/Strength: Functionally intact. No obvious neuro-muscular anomalies detected.  Sensory (Neurological): Unimpaired  Sensory (Neurological): Unimpaired          Palpation: No palpable anomalies              Palpation: No palpable anomalies              Provocative Test(s):  Phalen's test: deferred Tinel's test: deferred Apley's scratch test (touch opposite shoulder):  Action 1 (Across chest): deferred Action 2 (Overhead): deferred Action 3 (LB reach): deferred   Provocative Test(s):  Phalen's test: deferred Tinel's test: deferred Apley's scratch test (touch opposite shoulder):  Action 1 (Across chest): deferred Action 2 (Overhead): deferred Action 3 (LB reach): deferred    Thoracic Spine Area Exam  Skin & Axial Inspection: No masses, redness, or swelling Alignment: Symmetrical Functional ROM: Unrestricted ROM Stability: No instability detected Muscle Tone/Strength: Functionally intact. No obvious neuro-muscular anomalies detected. Sensory (Neurological): Unimpaired Muscle strength & Tone: No palpable anomalies  Lumbar Spine Area Exam  Skin & Axial Inspection: No masses, redness, or swelling Alignment: Symmetrical Functional ROM: Decreased ROM       Stability: No instability detected Muscle Tone/Strength: Functionally intact. No obvious neuro-muscular anomalies detected. Sensory (Neurological): Unimpaired Palpation: No palpable anomalies       Provocative Tests: Hyperextension/rotation test: deferred today       Lumbar quadrant test (Kemp's test): deferred today       Lateral bending test: deferred today       Patrick's Maneuver: deferred today                   FABER test: deferred today                   S-I anterior distraction/compression  test: deferred today         S-I lateral compression test: deferred today         S-I Thigh-thrust test: deferred today         S-I Gaenslen's test: deferred today          Gait & Posture Assessment  Ambulation: Patient came in today in a wheel chair Gait: Modified gait pattern (slower gait speed, wider stride width, and longer stance duration) associated with morbid obesity Posture: WNL   Lower Extremity Exam    Side: Right lower extremity  Side: Left lower extremity  Stability: No instability observed          Stability: No instability observed          Skin & Extremity Inspection: Skin color, temperature, and hair growth are WNL. No peripheral edema or cyanosis. No masses, redness, swelling, asymmetry, or associated skin lesions. No contractures.  Skin & Extremity Inspection: Skin color, temperature, and hair growth are WNL. No peripheral edema or cyanosis. No masses, redness, swelling, asymmetry, or associated skin lesions. No contractures.  Functional ROM: Unrestricted ROM                  Functional ROM: Unrestricted ROM                  Muscle Tone/Strength: Functionally intact. No obvious neuro-muscular anomalies detected.  Muscle Tone/Strength: Functionally intact. No obvious neuro-muscular anomalies detected.  Sensory (Neurological): Unimpaired  Sensory (Neurological): Unimpaired  Palpation: No palpable anomalies  Palpation: No palpable anomalies   Assessment  Primary Diagnosis & Pertinent Problem List: The primary encounter diagnosis was Cervicalgia. Diagnoses of Cervical radiculitis, Chronic low back pain (Primary Area of Pain) (Bilateral) (L>R) w/o sciatica, Lumbar facet syndrome (Bilateral) (L>R), DDD (degenerative disc disease), cervical,  Lumbar facet arthropathy (L2-3, L3-4, L4-5) (Bilateral), and Spondylosis without myelopathy or radiculopathy, lumbosacral region were also pertinent to this visit.  Status Diagnosis  Improving Resolved Controlled 1. Cervicalgia   2.  Cervical radiculitis   3. Chronic low back pain (Primary Area of Pain) (Bilateral) (L>R) w/o sciatica   4. Lumbar facet syndrome (Bilateral) (L>R)   5. DDD (degenerative disc disease), cervical   6. Lumbar facet arthropathy (L2-3, L3-4, L4-5) (Bilateral)   7. Spondylosis without myelopathy or radiculopathy, lumbosacral region     Problems updated and reviewed during this visit: No problems updated. Plan of Care  Pharmacotherapy (Medications Ordered): No orders of the defined types were placed in this encounter.  Medications administered today: Antonio Hampe had no medications administered during this visit.   Procedure Orders     Cervical Epidural Injection Lab Orders  No laboratory test(s) ordered today   Imaging Orders  No imaging studies ordered today   Referral Orders  No referral(s) requested today   Interventional management options: Planned, scheduled, and/or pending:   NOTE: NO RFA until she brings BMI down to 30-32. Diagnostic right-sided C7-T1 cervical epidural steroid injection #2    Considering:   Diagnostic right-sided C7-T1 cervical epidural steroid injection #2  Diagnostic bilateral lumbar facet block #3 Possible bilateral lumbar facet RFA(Starting w/ left) (Needs to bring BMI down to 30-32 before proceeding.) Diagnostic bilateral sacroiliac joint block Possible bilateral sacroiliac joint RFA Diagnostic L2-3 LESI Diagnostic L3-4 LESI Diagnostic L4-5 LESI Diagnostic right-sided L2-3 transforaminalESI Diagnostic bilateral L4-5 transforaminal ESI Diagnostic bilateral intra-articular hip joint injection Diagnostic bilateral femoral nerve +obturatornerve block Possible bilateral femoral nerve +obturator nerve RFA Diagnostic bilateral intra-articular knee injectionwith local anesthetic and steroid  Possible bilateral, series of 5, intra-articular Hyalgan knee injections Diagnostic bilateral genicular nerve block Possible bilateral genicular  nerve RFA   Palliative PRN treatment(s):   Palliative bilateral lumbar facet block    Provider-requested follow-up: Return for Procedure (no sedation): (R) CESI #2.  Future Appointments  Date Time Provider [place]  03/02/2018  9:30 AM Milinda Pointer, MD [facility] None   Primary Care Physician: Tracie Harrier, MD Location: [facility] Note by: Gaspar Cola, MD Date: 02/08/2018; Time: 10:04 AM

## 2018-02-08 ENCOUNTER — Encounter: Payer: Self-pay | Admitting: Pain Medicine

## 2018-02-08 ENCOUNTER — Ambulatory Visit: Payer: BLUE CROSS/BLUE SHIELD | Attending: Pain Medicine | Admitting: Pain Medicine

## 2018-02-08 ENCOUNTER — Other Ambulatory Visit: Payer: Self-pay

## 2018-02-08 VITALS — BP 110/51 | HR 68 | Temp 97.8°F | Ht 64.0 in | Wt 330.0 lb

## 2018-02-08 DIAGNOSIS — G4733 Obstructive sleep apnea (adult) (pediatric): Secondary | ICD-10-CM | POA: Insufficient documentation

## 2018-02-08 DIAGNOSIS — Z79891 Long term (current) use of opiate analgesic: Secondary | ICD-10-CM | POA: Insufficient documentation

## 2018-02-08 DIAGNOSIS — F329 Major depressive disorder, single episode, unspecified: Secondary | ICD-10-CM | POA: Diagnosis not present

## 2018-02-08 DIAGNOSIS — M159 Polyosteoarthritis, unspecified: Secondary | ICD-10-CM | POA: Insufficient documentation

## 2018-02-08 DIAGNOSIS — D493 Neoplasm of unspecified behavior of breast: Secondary | ICD-10-CM | POA: Diagnosis not present

## 2018-02-08 DIAGNOSIS — E1142 Type 2 diabetes mellitus with diabetic polyneuropathy: Secondary | ICD-10-CM | POA: Insufficient documentation

## 2018-02-08 DIAGNOSIS — E559 Vitamin D deficiency, unspecified: Secondary | ICD-10-CM | POA: Insufficient documentation

## 2018-02-08 DIAGNOSIS — J309 Allergic rhinitis, unspecified: Secondary | ICD-10-CM | POA: Diagnosis not present

## 2018-02-08 DIAGNOSIS — J0100 Acute maxillary sinusitis, unspecified: Secondary | ICD-10-CM | POA: Insufficient documentation

## 2018-02-08 DIAGNOSIS — Z9889 Other specified postprocedural states: Secondary | ICD-10-CM | POA: Insufficient documentation

## 2018-02-08 DIAGNOSIS — M48061 Spinal stenosis, lumbar region without neurogenic claudication: Secondary | ICD-10-CM | POA: Insufficient documentation

## 2018-02-08 DIAGNOSIS — M545 Low back pain: Secondary | ICD-10-CM

## 2018-02-08 DIAGNOSIS — G8929 Other chronic pain: Secondary | ICD-10-CM

## 2018-02-08 DIAGNOSIS — M25511 Pain in right shoulder: Secondary | ICD-10-CM | POA: Diagnosis present

## 2018-02-08 DIAGNOSIS — I10 Essential (primary) hypertension: Secondary | ICD-10-CM | POA: Insufficient documentation

## 2018-02-08 DIAGNOSIS — M47817 Spondylosis without myelopathy or radiculopathy, lumbosacral region: Secondary | ICD-10-CM | POA: Diagnosis not present

## 2018-02-08 DIAGNOSIS — M5030 Other cervical disc degeneration, unspecified cervical region: Secondary | ICD-10-CM

## 2018-02-08 DIAGNOSIS — R000 Tachycardia, unspecified: Secondary | ICD-10-CM | POA: Insufficient documentation

## 2018-02-08 DIAGNOSIS — Z6841 Body Mass Index (BMI) 40.0 and over, adult: Secondary | ICD-10-CM | POA: Insufficient documentation

## 2018-02-08 DIAGNOSIS — G894 Chronic pain syndrome: Secondary | ICD-10-CM | POA: Insufficient documentation

## 2018-02-08 DIAGNOSIS — M5136 Other intervertebral disc degeneration, lumbar region: Secondary | ICD-10-CM | POA: Diagnosis not present

## 2018-02-08 DIAGNOSIS — M5412 Radiculopathy, cervical region: Secondary | ICD-10-CM | POA: Diagnosis not present

## 2018-02-08 DIAGNOSIS — K219 Gastro-esophageal reflux disease without esophagitis: Secondary | ICD-10-CM | POA: Diagnosis not present

## 2018-02-08 DIAGNOSIS — D649 Anemia, unspecified: Secondary | ICD-10-CM | POA: Insufficient documentation

## 2018-02-08 DIAGNOSIS — Z79899 Other long term (current) drug therapy: Secondary | ICD-10-CM | POA: Insufficient documentation

## 2018-02-08 DIAGNOSIS — Z888 Allergy status to other drugs, medicaments and biological substances status: Secondary | ICD-10-CM | POA: Insufficient documentation

## 2018-02-08 DIAGNOSIS — M5010 Cervical disc disorder with radiculopathy, unspecified cervical region: Secondary | ICD-10-CM | POA: Insufficient documentation

## 2018-02-08 DIAGNOSIS — M542 Cervicalgia: Secondary | ICD-10-CM | POA: Diagnosis not present

## 2018-02-08 DIAGNOSIS — M47816 Spondylosis without myelopathy or radiculopathy, lumbar region: Secondary | ICD-10-CM

## 2018-02-08 DIAGNOSIS — Z9049 Acquired absence of other specified parts of digestive tract: Secondary | ICD-10-CM | POA: Insufficient documentation

## 2018-02-08 DIAGNOSIS — Z7984 Long term (current) use of oral hypoglycemic drugs: Secondary | ICD-10-CM | POA: Insufficient documentation

## 2018-02-08 DIAGNOSIS — R700 Elevated erythrocyte sedimentation rate: Secondary | ICD-10-CM | POA: Insufficient documentation

## 2018-02-08 DIAGNOSIS — Z1231 Encounter for screening mammogram for malignant neoplasm of breast: Secondary | ICD-10-CM | POA: Diagnosis not present

## 2018-02-08 NOTE — Patient Instructions (Signed)
____________________________________________________________________________________________  Preparing for your procedure (without sedation)  Instructions: . Oral Intake: Do not eat or drink anything for at least 3 hours prior to your procedure. . Transportation: Unless otherwise stated by your physician, you may drive yourself after the procedure. . Blood Pressure Medicine: Take your blood pressure medicine with a sip of water the morning of the procedure. . Blood thinners: Notify our staff if you are taking any blood thinners. Depending on which one you take, there will be specific instructions on how and when to stop it. . Diabetics on insulin: Notify the staff so that you can be scheduled 1st case in the morning. If your diabetes requires high dose insulin, take only  of your normal insulin dose the morning of the procedure and notify the staff that you have done so. . Preventing infections: Shower with an antibacterial soap the morning of your procedure.  . Build-up your immune system: Take 1000 mg of Vitamin C with every meal (3 times a day) the day prior to your procedure. Marland Kitchen Antibiotics: Inform the staff if you have a condition or reason that requires you to take antibiotics before dental procedures. . Pregnancy: If you are pregnant, call and cancel the procedure. . Sickness: If you have a cold, fever, or any active infections, call and cancel the procedure. . Arrival: You must be in the facility at least 30 minutes prior to your scheduled procedure. . Children: Do not bring any children with you. . Dress appropriately: Bring dark clothing that you would not mind if they get stained. . Valuables: Do not bring any jewelry or valuables.  Procedure appointments are reserved for interventional treatments only. Marland Kitchen No Prescription Refills. . No medication changes will be discussed during procedure appointments. . No disability issues will be discussed.  Reasons to call and reschedule or  cancel your procedure: (Following these recommendations will minimize the risk of a serious complication.) . Surgeries: Avoid having procedures within 2 weeks of any surgery. (Avoid for 2 weeks before or after any surgery). . Flu Shots: Avoid having procedures within 2 weeks of a flu shots or . (Avoid for 2 weeks before or after immunizations). . Barium: Avoid having a procedure within 7-10 days after having had a radiological study involving the use of radiological contrast. (Myelograms, Barium swallow or enema study). . Heart attacks: Avoid any elective procedures or surgeries for the initial 6 months after a "Myocardial Infarction" (Heart Attack). . Blood thinners: It is imperative that you stop these medications before procedures. Let us know if you if you take any blood thinner.  . Infection: Avoid procedures during or within two weeks of an infection (including chest colds or gastrointestinal problems). Symptoms associated with infections include: Localized redness, fever, chills, night sweats or profuse sweating, burning sensation when voiding, cough, congestion, stuffiness, runny nose, sore throat, diarrhea, nausea, vomiting, cold or Flu symptoms, recent or current infections. It is specially important if the infection is over the area that we intend to treat. Marland Kitchen Heart and lung problems: Symptoms that may suggest an active cardiopulmonary problem include: cough, chest pain, breathing difficulties or shortness of breath, dizziness, ankle swelling, uncontrolled high or unusually low blood pressure, and/or palpitations. If you are experiencing any of these symptoms, cancel your procedure and contact your primary care physician for an evaluation.  Remember:  Regular Business hours are:  Monday to Thursday 8:00 AM to 4:00 PM  Provider's Schedule: Milinda Pointer, MD:  Procedure days: Tuesday and Thursday 7:30 AM to  4:00 PM  Bilal Lateef, MD:  Procedure days: Monday and Wednesday 7:30 AM to 4:00  PM ____________________________________________________________________________________________    

## 2018-03-02 ENCOUNTER — Encounter: Payer: Self-pay | Admitting: Pain Medicine

## 2018-03-02 ENCOUNTER — Ambulatory Visit
Admission: RE | Admit: 2018-03-02 | Discharge: 2018-03-02 | Disposition: A | Discharge status: Home / Self Care | Payer: BLUE CROSS/BLUE SHIELD | Source: Ambulatory Visit | Attending: Pain Medicine | Admitting: Pain Medicine

## 2018-03-02 ENCOUNTER — Ambulatory Visit (HOSPITAL_BASED_OUTPATIENT_CLINIC_OR_DEPARTMENT_OTHER): Payer: BLUE CROSS/BLUE SHIELD | Admitting: Pain Medicine

## 2018-03-02 ENCOUNTER — Other Ambulatory Visit: Payer: Self-pay

## 2018-03-02 VITALS — BP 121/88 | HR 78 | Temp 98.2°F | Resp 20 | Ht 64.0 in | Wt 335.0 lb

## 2018-03-02 DIAGNOSIS — M5412 Radiculopathy, cervical region: Secondary | ICD-10-CM | POA: Diagnosis present

## 2018-03-02 DIAGNOSIS — M5030 Other cervical disc degeneration, unspecified cervical region: Secondary | ICD-10-CM | POA: Diagnosis present

## 2018-03-02 DIAGNOSIS — M5010 Cervical disc disorder with radiculopathy, unspecified cervical region: Secondary | ICD-10-CM | POA: Diagnosis not present

## 2018-03-02 DIAGNOSIS — Z888 Allergy status to other drugs, medicaments and biological substances status: Secondary | ICD-10-CM | POA: Diagnosis not present

## 2018-03-02 DIAGNOSIS — Z79899 Other long term (current) drug therapy: Secondary | ICD-10-CM | POA: Insufficient documentation

## 2018-03-02 DIAGNOSIS — M542 Cervicalgia: Secondary | ICD-10-CM

## 2018-03-02 DIAGNOSIS — G894 Chronic pain syndrome: Secondary | ICD-10-CM

## 2018-03-02 DIAGNOSIS — M792 Neuralgia and neuritis, unspecified: Secondary | ICD-10-CM

## 2018-03-02 MED ORDER — LIDOCAINE HCL 2 % IJ SOLN
20.0000 mL | Freq: Once | INTRAMUSCULAR | Status: AC
  Administered 2018-03-02: 400 mg
  Filled 2018-03-02: qty 40

## 2018-03-02 MED ORDER — SODIUM CHLORIDE 0.9% FLUSH
1.0000 mL | Freq: Once | INTRAVENOUS | Status: AC
  Administered 2018-03-02: 1 mL

## 2018-03-02 MED ORDER — SODIUM CHLORIDE 0.9 % IJ SOLN
INTRAMUSCULAR | Status: AC
  Filled 2018-03-02: qty 10

## 2018-03-02 MED ORDER — IOPAMIDOL (ISOVUE-M 200) INJECTION 41%
10.0000 mL | Freq: Once | INTRAMUSCULAR | Status: AC
  Administered 2018-03-02: 10 mL via EPIDURAL
  Filled 2018-03-02: qty 10

## 2018-03-02 MED ORDER — TRAMADOL HCL 50 MG PO TABS: 50.0000 mg | ORAL_TABLET | Freq: Four times a day (QID) | ORAL | 2 refills | Status: DC | PRN

## 2018-03-02 MED ORDER — GABAPENTIN 300 MG PO CAPS: 300.0000 mg | ORAL_CAPSULE | Freq: Four times a day (QID) | ORAL | 2 refills | Status: AC

## 2018-03-02 MED ORDER — DEXAMETHASONE SODIUM PHOSPHATE 10 MG/ML IJ SOLN
10.0000 mg | Freq: Once | INTRAMUSCULAR | Status: AC
  Administered 2018-03-02: 10 mg
  Filled 2018-03-02: qty 1

## 2018-03-02 MED ORDER — ROPIVACAINE HCL 2 MG/ML IJ SOLN
1.0000 mL | Freq: Once | INTRAMUSCULAR | Status: DC
  Filled 2018-03-02: qty 10

## 2018-03-02 NOTE — Progress Notes (Signed)
Safety precautions to be maintained throughout the outpatient stay will include: orient to surroundings, keep bed in low position, maintain call bell within reach at all times, provide assistance with transfer out of bed and ambulation.  

## 2018-03-02 NOTE — Progress Notes (Signed)
Patient's Name: Sabrina Castro  MRN: [id number]  Referring Provider: Tracie Harrier, MD  DOB: 1961/03/16  PCP: Tracie Harrier, MD  DOS: 03/02/2018  Note by: Gaspar Cola, MD  Service setting: Ambulatory outpatient  Specialty: Interventional Pain Management  Patient type: Established  Location: [facility] (AMB) Pain Management Facility  Visit type: Interventional Procedure   Primary Reason for Visit: Interventional Pain Management Treatment. CC: Procedure (cervical epidural )  Procedure:          Anesthesia, Analgesia, Anxiolysis:  Type: Diagnostic, Inter-Laminar, Epidural Steroid Injection  #2  Region: Posterior Cervico-thoracic Region Level: C7-T1 Laterality: Right-Sided Paramedial  Type: Local Anesthesia Indication(s): Analgesia         Route: Infiltration ([place]/IM) IV Access: Declined Sedation: Declined  Local Anesthetic: Lidocaine 1-2%  Position: Prone with head of the table was raised to facilitate breathing.   Indications: 1. DDD (degenerative disc disease), cervical   2. Cervicalgia   3. Cervical radiculitis    Pain Score: Pre-procedure: 2 /10 Post-procedure: 0-No pain/10  Pre-op Assessment:  Sabrina Castro is a 57 y.o. (year old), female patient, seen today for interventional treatment. She  has a past surgical history that includes Colonoscopy with propofol (N/A, 01/09/2016); Esophagogastroduodenoscopy (egd) with propofol (N/A, 01/09/2016); and Cholecystectomy open. Sabrina Castro has a current medication list which includes the following prescription(s): amlodipine, duloxetine, ergocalciferol, ferrous sulfate, metformin, metoprolol succinate, multivitamin, olmesartan-hydrochlorothiazide, pantoprazole, trazodone, gabapentin, and tramadol, and the following Facility-Administered Medications: ropivacaine (pf) 2 mg/ml (0.2%). Her primarily concern today is the Procedure (cervical epidural )  Initial Vital Signs:  Pulse/HCG Rate: 76  Temp: 98.2 F (36.8 C) Resp: 18 BP:  113/75 SpO2: 100 %  BMI: Estimated body mass index is 57.5 kg/m as calculated from the following:   Height as of this encounter: 5\' 4"  (1.626 m).   Weight as of this encounter: 335 lb (152 kg).  Risk Assessment: Allergies: Reviewed. She is allergic to lisinopril.  Allergy Precautions: None required Coagulopathies: Reviewed. None identified.  Blood-thinner therapy: None at this time Active Infection(s): Reviewed. None identified. Sabrina Castro is afebrile  Site Confirmation: Sabrina Castro was asked to confirm the procedure and laterality before marking the site Procedure checklist: Completed Consent: Before the procedure and under the influence of no sedative(s), amnesic(s), or anxiolytics, the patient was informed of the treatment options, risks and possible complications. To fulfill our ethical and legal obligations, as recommended by the American Medical Association's Code of Ethics, I have informed the patient of my clinical impression; the nature and purpose of the treatment or procedure; the risks, benefits, and possible complications of the intervention; the alternatives, including doing nothing; the risk(s) and benefit(s) of the alternative treatment(s) or procedure(s); and the risk(s) and benefit(s) of doing nothing. The patient was provided information about the general risks and possible complications associated with the procedure. These may include, but are not limited to: failure to achieve desired goals, infection, bleeding, organ or nerve damage, allergic reactions, paralysis, and death. In addition, the patient was informed of those risks and complications associated to Spine-related procedures, such as failure to decrease pain; infection (i.e.: Meningitis, epidural or intraspinal abscess); bleeding (i.e.: epidural hematoma, subarachnoid hemorrhage, or any other type of intraspinal or peri-dural bleeding); organ or nerve damage (i.e.: Any type of peripheral nerve, nerve root, or spinal  cord injury) with subsequent damage to sensory, motor, and/or autonomic systems, resulting in permanent pain, numbness, and/or weakness of one or several areas of the body; allergic reactions; (i.e.: anaphylactic reaction); and/or death.  Furthermore, the patient was informed of those risks and complications associated with the medications. These include, but are not limited to: allergic reactions (i.e.: anaphylactic or anaphylactoid reaction(s)); adrenal axis suppression; blood sugar elevation that in diabetics may result in ketoacidosis or comma; water retention that in patients with history of congestive heart failure may result in shortness of breath, pulmonary edema, and decompensation with resultant heart failure; weight gain; swelling or edema; medication-induced neural toxicity; particulate matter embolism and blood vessel occlusion with resultant organ, and/or nervous system infarction; and/or aseptic necrosis of one or more joints. Finally, the patient was informed that Medicine is not an exact science; therefore, there is also the possibility of unforeseen or unpredictable risks and/or possible complications that may result in a catastrophic outcome. The patient indicated having understood very clearly. We have given the patient no guarantees and we have made no promises. Enough time was given to the patient to ask questions, all of which were answered to the patient's satisfaction. Sabrina Castro has indicated that she wanted to continue with the procedure. Attestation: I, the ordering provider, attest that I have discussed with the patient the benefits, risks, side-effects, alternatives, likelihood of achieving goals, and potential problems during recovery for the procedure that I have provided informed consent. Date  Time: 03/02/2018  7:58 AM  Pre-Procedure Preparation:  Monitoring: As per clinic protocol. Respiration, ETCO2, SpO2, BP, heart rate and rhythm monitor placed and checked for adequate  function Safety Precautions: Patient was assessed for positional comfort and pressure points before starting the procedure. Time-out: I initiated and conducted the "Time-out" before starting the procedure, as per protocol. The patient was asked to participate by confirming the accuracy of the "Time Out" information. Verification of the correct person, site, and procedure were performed and confirmed by me, the nursing staff, and the patient. "Time-out" conducted as per Joint Commission's Universal Protocol (UP.01.01.01). Time: 8416  Description of Procedure:          Target Area: For Epidural Steroid injections the target is the interlaminar space, initially targeting the lower border of the superior vertebral body lamina. Approach: Paramedial approach. Area Prepped: Entire PosteriorCervical Region Prepping solution: ChloraPrep (2% chlorhexidine gluconate and 70% isopropyl alcohol) Safety Precautions: Aspiration looking for blood return was conducted prior to all injections. At no point did we inject any substances, as a needle was being advanced. No attempts were made at seeking any paresthesias. Safe injection practices and needle disposal techniques used. Medications properly checked for expiration dates. SDV (single dose vial) medications used. Description of the Procedure: Protocol guidelines were followed. The procedure needle was introduced through the skin, ipsilateral to the reported pain, and advanced to the target area. Bone was contacted and the needle walked caudad, until the lamina was cleared. The epidural space was identified using "loss-of-resistance technique" with 2-3 ml of PF-NaCl (0.9% NSS), in a 5cc LOR glass syringe. Vitals:   03/02/18 0830 03/02/18 0835 03/02/18 0840 03/02/18 0845  BP: 131/84 131/86 (!) 119/93 121/88  Pulse: 74 77 77 78  Resp: (!) 21 (!) 22 18 20   Temp:      SpO2: 100% 100% 97% 98%  Weight:      Height:        Start Time: 0833 hrs. End Time: 0841  hrs. Materials:  Needle(s) Type: Epidural needle Gauge: 17G Length: 3.5-in Medication(s): Please see orders for medications and dosing details.  Imaging Guidance (Spinal):          Type of Imaging Technique: Fluoroscopy  Guidance (Spinal) Indication(s): Assistance in needle guidance and placement for procedures requiring needle placement in or near specific anatomical locations not easily accessible without such assistance. Exposure Time: Please see nurses notes. Contrast: Before injecting any contrast, we confirmed that the patient did not have an allergy to iodine, shellfish, or radiological contrast. Once satisfactory needle placement was completed at the desired level, radiological contrast was injected. Contrast injected under live fluoroscopy. No contrast complications. See chart for type and volume of contrast used. Fluoroscopic Guidance: I was personally present during the use of fluoroscopy. "Tunnel Vision Technique" used to obtain the best possible view of the target area. Parallax error corrected before commencing the procedure. "Direction-depth-direction" technique used to introduce the needle under continuous pulsed fluoroscopy. Once target was reached, antero-posterior, oblique, and lateral fluoroscopic projection used confirm needle placement in all planes. Images permanently stored in EMR. Interpretation: I personally interpreted the imaging intraoperatively. Adequate needle placement confirmed in multiple planes. Appropriate spread of contrast into desired area was observed. No evidence of afferent or efferent intravascular uptake. No intrathecal or subarachnoid spread observed. Permanent images saved into the patient's record.  Antibiotic Prophylaxis:   Anti-infectives (From admission, onward)   None     Indication(s): None identified  Post-operative Assessment:  Post-procedure Vital Signs:  Pulse/HCG Rate: 78  Temp: 98.2 F (36.8 C) Resp: 20 BP: 121/88 SpO2: 98  %  EBL: None  Complications: No immediate post-treatment complications observed by team, or reported by patient.  Note: The patient tolerated the entire procedure well. A repeat set of vitals were taken after the procedure and the patient was kept under observation following institutional policy, for this type of procedure. Post-procedural neurological assessment was performed, showing return to baseline, prior to discharge. The patient was provided with post-procedure discharge instructions, including a section on how to identify potential problems. Should any problems arise concerning this procedure, the patient was given instructions to immediately contact us, at any time, without hesitation. In any case, we plan to contact the patient by telephone for a follow-up status report regarding this interventional procedure.  Comments:  No additional relevant information.  Plan of Care    Imaging Orders     DG C-Arm 1-60 Min-No Report  Procedure Orders     Cervical Epidural Injection  Medications ordered for procedure: Meds ordered this encounter  Medications  . iopamidol (ISOVUE-M) 41 % intrathecal injection 10 mL    Must be Myelogram-compatible. If not available, you may substitute with a water-soluble, non-ionic, hypoallergenic, myelogram-compatible radiological contrast medium.  Marland Kitchen lidocaine (XYLOCAINE) 2 % (with pres) injection 400 mg  . sodium chloride flush (NS) 0.9 % injection 1 mL  . ropivacaine (PF) 2 mg/mL (0.2%) (NAROPIN) injection 1 mL  . dexamethasone (DECADRON) injection 10 mg  . traMADol (ULTRAM) 50 MG tablet    Sig: Take 1-2 tablets (50-100 mg total) by mouth every 6 (six) hours as needed for severe pain.    Dispense:  240 tablet    Refill:  2    [place] STOP ACT - Not applicable. Fill one day early if pharmacy is closed on scheduled refill date. Must last 30 days.  Marland Kitchen gabapentin (NEURONTIN) 300 MG capsule    Sig: Take 1-3 capsules (300-900 mg total) by mouth 4 (four) times  daily. Follow the written titration schedule.    Dispense:  360 capsule    Refill:  2    Do not place this medication, or any other prescription from our practice, on "Automatic Refill".  Patient may have prescription filled one day early if pharmacy is closed on scheduled refill date.   Medications administered: We administered iopamidol, lidocaine, sodium chloride flush, and dexamethasone.  See the medical record for exact dosing, route, and time of administration.  Disposition: Discharge home  Discharge Date & Time: 03/02/2018; 0846 hrs.   Physician-requested Follow-up: Return for post-procedure eval (2 wks), w/ Dr. Dossie Arbour.  Future Appointments  Date Time Provider [place]  03/20/2018  8:15 AM Milinda Pointer, MD [facility] None   Primary Care Physician: Tracie Harrier, MD Location: [facility] Outpatient Pain Management Facility Note by: Gaspar Cola, MD Date: 03/02/2018; Time: 9:33 AM  Disclaimer:  Medicine is not an exact science. The only guarantee in medicine is that nothing is guaranteed. It is important to note that the decision to proceed with this intervention was based on the information collected from the patient. The Data and conclusions were drawn from the patient's questionnaire, the interview, and the physical examination. Because the information was provided in large part by the patient, it cannot be guaranteed that it has not been purposely or unconsciously manipulated. Every effort has been made to obtain as much relevant data as possible for this evaluation. It is important to note that the conclusions that lead to this procedure are derived in large part from the available data. Always take into account that the treatment will also be dependent on availability of resources and existing treatment guidelines, considered by other Pain Management Practitioners as being common knowledge and practice, at the time of the intervention. For Medico-Legal purposes,  it is also important to point out that variation in procedural techniques and pharmacological choices are the acceptable norm. The indications, contraindications, technique, and results of the above procedure should only be interpreted and judged by a Board-Certified Interventional Pain Specialist with extensive familiarity and expertise in the same exact procedure and technique.

## 2018-03-02 NOTE — Patient Instructions (Addendum)
____________________________________________________________________________________________  Post-Procedure Discharge Instructions  Instructions:  Apply ice: Fill a plastic sandwich bag with crushed ice. Cover it with a small towel and apply to injection site. Apply for 15 minutes then remove x 15 minutes. Repeat sequence on day of procedure, until you go to bed. The purpose is to minimize swelling and discomfort after procedure.  Apply heat: Apply heat to procedure site starting the day following the procedure. The purpose is to treat any soreness and discomfort from the procedure.  Food intake: Start with clear liquids (like water) and advance to regular food, as tolerated.   Physical activities: Keep activities to a minimum for the first 8 hours after the procedure.   Driving: If you have received any sedation, you are not allowed to drive for 24 hours after your procedure.  Blood thinner: Restart your blood thinner 6 hours after your procedure. (Only for those taking blood thinners)  Insulin: As soon as you can eat, you may resume your normal dosing schedule. (Only for those taking insulin)  Infection prevention: Keep procedure site clean and dry.  Post-procedure Pain Diary: Extremely important that this be done correctly and accurately. Recorded information will be used to determine the next step in treatment.  Pain evaluated is that of treated area only. Do not include pain from an untreated area.  Complete every hour, on the hour, for the initial 8 hours. Set an alarm to help you do this part accurately.  Do not go to sleep and have it completed later. It will not be accurate.  Follow-up appointment: Keep your follow-up appointment after the procedure. Usually 2 weeks for most procedures. (6 weeks in the case of radiofrequency.) Bring you pain diary.   Expect:  From numbing medicine (AKA: Local Anesthetics): Numbness or decrease in pain.  Onset: Full effect within 15  minutes of injected.  Duration: It will depend on the type of local anesthetic used. On the average, 1 to 8 hours.   From steroids: Decrease in swelling or inflammation. Once inflammation is improved, relief of the pain will follow.  Onset of benefits: Depends on the amount of swelling present. The more swelling, the longer it will take for the benefits to be seen. In some cases, up to 10 days.  Duration: Steroids will stay in the system x 2 weeks. Duration of benefits will depend on multiple posibilities including persistent irritating factors.  Occasional side-effects: Facial flushing, cramps (if present, drink Gatorade and take over-the-counter Magnesium 450-500 mg once to twice a day).  From procedure: Some discomfort is to be expected once the numbing medicine wears off. This should be minimal if ice and heat are applied as instructed.  Call if:  You experience numbness and weakness that gets worse with time, as opposed to wearing off.  New onset bowel or bladder incontinence. (This applies to Spinal procedures only)  Emergency Numbers:  Durning business hours (Monday - Thursday, 8:00 AM - 4:00 PM) (Friday, 9:00 AM - 12:00 Noon): [phone]  After hours: [phone] ____________________________________________________________________________________________   Pain Management Discharge Instructions  General Discharge Instructions :  If you need to reach your doctor call: Monday-Friday 8:00 am - 4:00 pm at [phone] or toll free [phone].  After clinic hours [phone] to have operator reach doctor.  Bring all of your medication bottles to all your appointments in the pain clinic.  To cancel or reschedule your appointment with Pain Management please remember to call 24 hours in advance to avoid a fee.  Refer to the educational materials which you have been given on: General Risks, I had my Procedure. Discharge Instructions, Post Sedation.  Post Procedure  Instructions:  The drugs you were given will stay in your system until tomorrow, so for the next 24 hours you should not drive, make any legal decisions or drink any alcoholic beverages.  You may eat anything you prefer, but it is better to start with liquids then soups and crackers, and gradually work up to solid foods.  Please notify your doctor immediately if you have any unusual bleeding, trouble breathing or pain that is not related to your normal pain.  Depending on the type of procedure that was done, some parts of your body may feel week and/or numb.  This usually clears up by tonight or the next day.  Walk with the use of an assistive device or accompanied by an adult for the 24 hours.  You may use ice on the affected area for the first 24 hours.  Put ice in a Ziploc bag and cover with a towel and place against area 15 minutes on 15 minutes off.  You may switch to heat after 24 hours.

## 2018-03-03 ENCOUNTER — Telehealth: Payer: Self-pay

## 2018-03-03 NOTE — Telephone Encounter (Signed)
States she was sore yesterday but it feeling much better today. Patient states that she didn't rest or relax yesterday and was on the go.

## 2018-03-08 DIAGNOSIS — F3341 Major depressive disorder, recurrent, in partial remission: Secondary | ICD-10-CM | POA: Insufficient documentation

## 2018-03-08 DIAGNOSIS — R739 Hyperglycemia, unspecified: Secondary | ICD-10-CM | POA: Insufficient documentation

## 2018-03-16 NOTE — Progress Notes (Deleted)
Patient's Name: Sabrina Castro  MRN: [id number]  Referring Provider: Tracie Harrier, MD  DOB: 27-May-1960  PCP: Tracie Harrier, MD  DOS: 03/20/2018  Note by: Gaspar Cola, MD  Service setting: Ambulatory outpatient  Specialty: Interventional Pain Management  Location: [facility] (AMB) Pain Management Facility    Patient type: Established   Primary Reason(s) for Visit: Encounter for post-procedure evaluation of chronic illness with mild to moderate exacerbation CC: No chief complaint on file.  HPI  Sabrina Castro is a 57 y.o. year old, female patient, who comes today for a post-procedure evaluation. She has Acute maxillary sinusitis, unspecified; Allergic rhinitis; Anemia; DDD (degenerative disc disease), lumbar; Depression; Elevated erythrocyte sedimentation rate; Encounter for other general examination; Encounter for screening mammogram for malignant neoplasm of breast; Essential (primary) hypertension; Generalized osteoarthritis of multiple sites; GERD (gastroesophageal reflux disease); History of tachycardia; Morbid obesity with BMI of 50.0-59.9, adult ([place]); Obesity, unspecified; Numbness and tingling of foot; Obstructive sleep apnea; Other fecal abnormalities; Other general symptoms and signs; Other specified anxiety disorders; Pain in right knee; Polyneuropathy; Prediabetes; Tachycardia; Type 2 diabetes mellitus without complication, without long-term current use of insulin ([place]); Vitamin D deficiency, unspecified; Morbid obesity with BMI of 40.0-44.9, adult ([place]); Chronic low back pain (Primary Area of Pain) (Bilateral) (Midline) (L>R) w/ sciatica (Bilateral); Chronic hip pain (Secondary Area of Pain) (Bilateral) (L>R); Chronic lower extremity pain (Tertiary Area of Pain) (Bilateral) (L>R); Chronic knee pain (Fourth Area of Pain) (Bilateral) (R>L); Chronic pain syndrome; Long term current use of opiate analgesic; Pharmacologic therapy; Disorder of skeletal system; Problems influencing health status;  Chronic sacroiliac joint pain; Anterolisthesis (L2-3 and L4-5); Lumbar facet arthropathy (L2-3, L3-4, L4-5) (Bilateral); Lumbar foraminal stenosis (Right: L2-3) (Bilateral: L4-5); Lumbar lateral recess stenosis (Right: L2-3) (Bilateral: L4-5); Abnormal MRI, lumbar spine; Osteoarthritis of hips (Bilateral); Osteoarthritis of knees (Bilateral); Spondylosis without myelopathy or radiculopathy, lumbosacral region; Other specified dorsopathies, sacral and sacrococcygeal region; Elevated C-reactive protein (CRP); Neurogenic pain; Lumbar spine instability; Lumbar facet syndrome (Bilateral) (L>R); Chronic low back pain (Primary Area of Pain) (Bilateral) (L>R) w/o sciatica; Cervicalgia; Myofascial pain; Abnormal x-ray of cervical spine; DDD (degenerative disc disease), cervical; and Cervical radiculitis on their problem list. Her primarily concern today is the No chief complaint on file.  Pain Assessment: Location:     Radiating:   Onset:   Duration:   Quality:   Severity:  /10 (subjective, self-reported pain score)  Note: Reported level is compatible with observation.                         When using our objective Pain Scale, levels between 6 and 10/10 are said to belong in an emergency room, as it progressively worsens from a 6/10, described as severely limiting, requiring emergency care not usually available at an outpatient pain management facility. At a 6/10 level, communication becomes difficult and requires great effort. Assistance to reach the emergency department may be required. Facial flushing and profuse sweating along with potentially dangerous increases in heart rate and blood pressure will be evident. Effect on ADL:   Timing:   Modifying factors:   BP:    HR:    Sabrina Castro comes in today for post-procedure evaluation.  Further details on both, my assessment(s), as well as the proposed treatment plan, please see below.  Post-Procedure Assessment  03/02/2018 Procedure: *** Pre-procedure  pain score:        /10 Post-procedure pain score: 0/10  Influential Factors: BMI:   Intra-procedural challenges: None observed.         Assessment challenges: None detected.              Reported side-effects: None.        Post-procedural adverse reactions or complications: None reported         Sedation: Please see nurses note. When no sedatives are used, the analgesic levels obtained are directly associated to the effectiveness of the local anesthetics. However, when sedation is provided, the level of analgesia obtained during the initial 1 hour following the intervention, is believed to be the result of a combination of factors. These factors may include, but are not limited to: 1. The effectiveness of the local anesthetics used. 2. The effects of the analgesic(s) and/or anxiolytic(s) used. 3. The degree of discomfort experienced by the patient at the time of the procedure. 4. The patients ability and reliability in recalling and recording the events. 5. The presence and influence of possible secondary gains and/or psychosocial factors. Reported result: Relief experienced during the 1st hour after the procedure:   (Ultra-Short Term Relief)            Interpretative annotation: Clinically appropriate result. Analgesia during this period is likely to be Local Anesthetic and/or IV Sedative (Analgesic/Anxiolytic) related.          Effects of local anesthetic: The analgesic effects attained during this period are directly associated to the localized infiltration of local anesthetics and therefore cary significant diagnostic value as to the etiological location, or anatomical origin, of the pain. Expected duration of relief is directly dependent on the pharmacodynamics of the local anesthetic used. Long-acting (4-6 hours) anesthetics used.  Reported result: Relief during the next 4 to 6 hour after the procedure:   (Short-Term Relief)            Interpretative annotation: Clinically  appropriate result. Analgesia during this period is likely to be Local Anesthetic-related.          Long-term benefit: Defined as the period of time past the expected duration of local anesthetics (1 hour for short-acting and 4-6 hours for long-acting). With the possible exception of prolonged sympathetic blockade from the local anesthetics, benefits during this period are typically attributed to, or associated with, other factors such as analgesic sensory neuropraxia, antiinflammatory effects, or beneficial biochemical changes provided by agents other than the local anesthetics.  Reported result: Extended relief following procedure:   (Long-Term Relief)            Interpretative annotation: Clinically possible results. Good relief. No permanent benefit expected. Inflammation plays a part in the etiology to the pain.          Current benefits: Defined as reported results that persistent at this point in time.   Analgesia: *** %            Function: Somewhat improved ROM: Somewhat improved Interpretative annotation: Recurrence of symptoms. No permanent benefit expected. Effective diagnostic intervention.          Interpretation: Results would suggest a successful diagnostic intervention.                  Plan:  Please see "Plan of Care" for details.                Laboratory Chemistry  Inflammation Markers (CRP: Acute Phase) (ESR: Chronic Phase) Lab Results  Component Value Date   CRP 28.4 (H) 09/13/2017   ESRSEDRATE 50 (H) 09/13/2017  Rheumatology Markers No results found for: RF, ANA, LABURIC, URICUR, LYMEIGGIGMAB, LYMEABIGMQN, HLAB27                      Renal Markers Lab Results  Component Value Date   BUN 8 09/13/2017   CREATININE 0.90 09/13/2017   BCR 9 09/13/2017   GFRAA 83 09/13/2017   GFRNONAA 72 09/13/2017                             Hepatic Markers Lab Results  Component Value Date   AST 14 09/13/2017   ALBUMIN 4.2 09/13/2017                         Neuropathy Markers Lab Results  Component Value Date   VITAMINB12 >2000 (H) 09/13/2017                        Hematology Parameters Lab Results  Component Value Date   PLT 410 04/28/2017   HGB 11.7 (L) 04/28/2017   HCT 35.9 04/28/2017                        CV Markers Lab Results  Component Value Date   TROPONINI <0.03 04/28/2017                         Note: Lab results reviewed.  Recent Imaging Results   Results for orders placed in visit on 03/02/18  DG C-Arm 1-60 Min-No Report   Narrative Fluoroscopy was utilized by the requesting physician.  No radiographic  interpretation.    Interpretation Report: Fluoroscopy was used during the procedure to assist with needle guidance. The images were interpreted intraoperatively by the requesting physician.  Meds   Current Outpatient Medications:  .  amLODipine (NORVASC) 5 MG tablet, Take 5 mg by mouth daily., Disp: , Rfl:  .  DULoxetine (CYMBALTA) 60 MG capsule, Take 60 mg by mouth daily., Disp: , Rfl:  .  ergocalciferol (VITAMIN D2) 50000 units capsule, Take 50,000 Units by mouth once a week., Disp: , Rfl:  .  ferrous sulfate 325 (65 FE) MG tablet, Take 325 mg by mouth daily with breakfast., Disp: , Rfl:  .  gabapentin (NEURONTIN) 300 MG capsule, Take 1-3 capsules (300-900 mg total) by mouth 4 (four) times daily. Follow the written titration schedule., Disp: 360 capsule, Rfl: 2 .  metFORMIN (GLUCOPHAGE) 500 MG tablet, Take 500 mg by mouth daily., Disp: , Rfl:  .  metoprolol succinate (TOPROL-XL) 50 MG 24 hr tablet, Take 50 mg by mouth daily. Take with or immediately following a meal., Disp: , Rfl:  .  Multiple Vitamin (MULTIVITAMIN) tablet, Take 1 tablet by mouth daily., Disp: , Rfl:  .  olmesartan-hydrochlorothiazide (BENICAR HCT) 20-12.5 MG tablet, Take 1 tablet by mouth daily., Disp: , Rfl:  .  pantoprazole (PROTONIX) 40 MG tablet, Take 40 mg by mouth daily., Disp: , Rfl:  .  traMADol (ULTRAM) 50 MG tablet, Take 1-2  tablets (50-100 mg total) by mouth every 6 (six) hours as needed for severe pain., Disp: 240 tablet, Rfl: 2 .  traZODone (DESYREL) 50 MG tablet, Take 50 mg by mouth at bedtime., Disp: , Rfl:   ROS  Constitutional: Denies any fever or chills Gastrointestinal: No reported hemesis, hematochezia, vomiting, or acute GI distress Musculoskeletal: Denies any acute  onset joint swelling, redness, loss of ROM, or weakness Neurological: No reported episodes of acute onset apraxia, aphasia, dysarthria, agnosia, amnesia, paralysis, loss of coordination, or loss of consciousness  Allergies  Sabrina Castro is allergic to lisinopril.  [place]  Drug: Sabrina Castro  reports that she does not use drugs. Alcohol:  reports that she does not drink alcohol. Tobacco:  reports that she has never smoked. She has never used smokeless tobacco. Medical:  has a past medical history of Depression, GERD (gastroesophageal reflux disease), and Hypertension. Surgical: Sabrina Castro  has a past surgical history that includes Colonoscopy with propofol (N/A, 01/09/2016); Esophagogastroduodenoscopy (egd) with propofol (N/A, 01/09/2016); and Cholecystectomy open. Family: family history includes Breast cancer in her maternal aunt.  Constitutional Exam  General appearance: Well nourished, well developed, and well hydrated. In no apparent acute distress There were no vitals filed for this visit. BMI Assessment: Estimated body mass index is 57.5 kg/m as calculated from the following:   Height as of 03/02/18: _0  (1.626 m).   Weight as of 03/02/18: 335 lb (152 kg).  BMI interpretation table: BMI level Category Range association with higher incidence of chronic pain  <18 kg/m2 Underweight   18.5-24.9 kg/m2 Ideal body weight   25-29.9 kg/m2 Overweight Increased incidence by 20%  30-34.9 kg/m2 Obese (Class I) Increased incidence by 68%  35-39.9 kg/m2 Severe obesity (Class II) Increased incidence by 136%  >40 kg/m2 Extreme obesity (Class III)  Increased incidence by 254%   Patient's current BMI Ideal Body weight  There is no height or weight on file to calculate BMI. Patient weight not recorded   BMI Readings from Last 4 Encounters:  03/02/18 57.50 kg/m  02/08/18 56.64 kg/m  01/19/18 57.50 kg/m  01/05/18 57.85 kg/m   Wt Readings from Last 4 Encounters:  03/02/18 (!) 335 lb (152 kg)  02/08/18 (!) 330 lb (149.7 kg)  01/19/18 (!) 335 lb (152 kg)  01/05/18 (!) 337 lb (152.9 kg)  Psych/Mental status: Alert, oriented x 3 (person, place, & time)       Eyes: PERLA Respiratory: No evidence of acute respiratory distress  Cervical Spine Area Exam  Skin & Axial Inspection: No masses, redness, edema, swelling, or associated skin lesions Alignment: Symmetrical Functional ROM: Unrestricted ROM      Stability: No instability detected Muscle Tone/Strength: Functionally intact. No obvious neuro-muscular anomalies detected. Sensory (Neurological): Unimpaired Palpation: No palpable anomalies              Upper Extremity (UE) Exam    Side: Right upper extremity  Side: Left upper extremity  Skin & Extremity Inspection: Skin color, temperature, and hair growth are WNL. No peripheral edema or cyanosis. No masses, redness, swelling, asymmetry, or associated skin lesions. No contractures.  Skin & Extremity Inspection: Skin color, temperature, and hair growth are WNL. No peripheral edema or cyanosis. No masses, redness, swelling, asymmetry, or associated skin lesions. No contractures.  Functional ROM: Unrestricted ROM          Functional ROM: Unrestricted ROM          Muscle Tone/Strength: Functionally intact. No obvious neuro-muscular anomalies detected.  Muscle Tone/Strength: Functionally intact. No obvious neuro-muscular anomalies detected.  Sensory (Neurological): Unimpaired          Sensory (Neurological): Unimpaired          Palpation: No palpable anomalies              Palpation: No palpable anomalies  Provocative Test(s):   Phalen's test: deferred Tinel's test: deferred Apley's scratch test (touch opposite shoulder):  Action 1 (Across chest): deferred Action 2 (Overhead): deferred Action 3 (LB reach): deferred   Provocative Test(s):  Phalen's test: deferred Tinel's test: deferred Apley's scratch test (touch opposite shoulder):  Action 1 (Across chest): deferred Action 2 (Overhead): deferred Action 3 (LB reach): deferred    Thoracic Spine Area Exam  Skin & Axial Inspection: No masses, redness, or swelling Alignment: Symmetrical Functional ROM: Unrestricted ROM Stability: No instability detected Muscle Tone/Strength: Functionally intact. No obvious neuro-muscular anomalies detected. Sensory (Neurological): Unimpaired Muscle strength & Tone: No palpable anomalies  Lumbar Spine Area Exam  Skin & Axial Inspection: No masses, redness, or swelling Alignment: Symmetrical Functional ROM: Unrestricted ROM       Stability: No instability detected Muscle Tone/Strength: Functionally intact. No obvious neuro-muscular anomalies detected. Sensory (Neurological): Unimpaired Palpation: No palpable anomalies       Provocative Tests: Hyperextension/rotation test: deferred today       Lumbar quadrant test (Kemp's test): deferred today       Lateral bending test: deferred today       Patrick's Maneuver: deferred today                   FABER test: deferred today                   S-I anterior distraction/compression test: deferred today         S-I lateral compression test: deferred today         S-I Thigh-thrust test: deferred today         S-I Gaenslen's test: deferred today          Gait & Posture Assessment  Ambulation: Unassisted Gait: Relatively normal for age and body habitus Posture: WNL   Lower Extremity Exam    Side: Right lower extremity  Side: Left lower extremity  Stability: No instability observed          Stability: No instability observed          Skin & Extremity Inspection: Skin color,  temperature, and hair growth are WNL. No peripheral edema or cyanosis. No masses, redness, swelling, asymmetry, or associated skin lesions. No contractures.  Skin & Extremity Inspection: Skin color, temperature, and hair growth are WNL. No peripheral edema or cyanosis. No masses, redness, swelling, asymmetry, or associated skin lesions. No contractures.  Functional ROM: Unrestricted ROM                  Functional ROM: Unrestricted ROM                  Muscle Tone/Strength: Functionally intact. No obvious neuro-muscular anomalies detected.  Muscle Tone/Strength: Functionally intact. No obvious neuro-muscular anomalies detected.  Sensory (Neurological): Unimpaired  Sensory (Neurological): Unimpaired  Palpation: No palpable anomalies  Palpation: No palpable anomalies   Assessment  Primary Diagnosis & Pertinent Problem List: There were no encounter diagnoses.  Status Diagnosis  Controlled Controlled Controlled No diagnosis found.  Problems updated and reviewed during this visit: No problems updated. Plan of Care  Pharmacotherapy (Medications Ordered): No orders of the defined types were placed in this encounter.  Medications administered today: Sabrina Castro had no medications administered during this visit.  Procedure Orders    No procedure(s) ordered today   Lab Orders  No laboratory test(s) ordered today   Imaging Orders  No imaging studies ordered today   Referral Orders  No referral(s)  requested today   Interventional management options: Planned, scheduled, and/or pending:   ***   Considering:   ***   Palliative PRN treatment(s):   None at this time   Provider-requested follow-up: No follow-ups on file.  Future Appointments  Date Time Provider [place]  03/20/2018  8:15 AM Milinda Pointer, MD [facility] None   Primary Care Physician: Tracie Harrier, MD Location: [facility] Note by: Gaspar Cola, MD Date:  03/20/2018; Time: 5:18 PM

## 2018-03-20 ENCOUNTER — Ambulatory Visit: Payer: BLUE CROSS/BLUE SHIELD | Admitting: Pain Medicine

## 2018-03-23 NOTE — Progress Notes (Signed)
Patient's Name: Sabrina Castro  MRN: [id number]  Referring Provider: Tracie Harrier, MD  DOB: Sep 10, 1960  PCP: Tracie Harrier, MD  DOS: 03/27/2018  Note by: Gaspar Cola, MD  Service setting: Ambulatory outpatient  Specialty: Interventional Pain Management  Location: [facility] (AMB) Pain Management Facility    Patient type: Established   Primary Reason(s) for Visit: Encounter for post-procedure evaluation of chronic illness with mild to moderate exacerbation CC: Neck Pain (right, "feels hard") and Shoulder Pain (right and armpit area on the right)  HPI  Sabrina Castro is a 57 y.o. year old, female patient, who comes today for a post-procedure evaluation. She has Acute maxillary sinusitis, unspecified; Allergic rhinitis; Anemia; DDD (degenerative disc disease), lumbar; Depression; Elevated erythrocyte sedimentation rate; Encounter for other general examination; Encounter for screening mammogram for malignant neoplasm of breast; Essential (primary) hypertension; Generalized osteoarthritis of multiple sites; GERD (gastroesophageal reflux disease); History of tachycardia; Morbid obesity with BMI of 50.0-59.9, adult ([place]); Obesity, unspecified; Numbness and tingling of foot; Obstructive sleep apnea; Other fecal abnormalities; Other general symptoms and signs; Other specified anxiety disorders; Pain in right knee; Polyneuropathy; Prediabetes; Tachycardia; Type 2 diabetes mellitus without complication, without long-term current use of insulin ([place]); Vitamin D deficiency, unspecified; Morbid obesity with BMI of 40.0-44.9, adult ([place]); Chronic low back pain (Primary Area of Pain) (Bilateral) (Midline) (L>R) w/ sciatica (Bilateral); Chronic hip pain (Secondary Area of Pain) (Bilateral) (L>R); Chronic lower extremity pain (Tertiary Area of Pain) (Bilateral) (L>R); Chronic knee pain (Fourth Area of Pain) (Bilateral) (R>L); Chronic pain syndrome; Long term current use of opiate analgesic; Pharmacologic therapy;  Disorder of skeletal system; Problems influencing health status; Chronic sacroiliac joint pain; Anterolisthesis (L2-3 and L4-5); Lumbar facet arthropathy (L2-3, L3-4, L4-5) (Bilateral); Lumbar foraminal stenosis (Right: L2-3) (Bilateral: L4-5); Lumbar lateral recess stenosis (Right: L2-3) (Bilateral: L4-5); Abnormal MRI, lumbar spine; Osteoarthritis of hips (Bilateral); Osteoarthritis of knees (Bilateral); Spondylosis without myelopathy or radiculopathy, lumbosacral region; Other specified dorsopathies, sacral and sacrococcygeal region; Elevated C-reactive protein (CRP); Neurogenic pain; Lumbar spine instability; Lumbar facet syndrome (Bilateral) (L>R); Chronic low back pain (Primary Area of Pain) (Bilateral) (L>R) w/o sciatica; Cervicalgia; Myofascial pain; Abnormal x-ray of cervical spine; DDD (degenerative disc disease), cervical; Cervical radiculitis; Elevated blood sugar level; Recurrent major depressive disorder, in partial remission ([place]); Spondylosis without myelopathy or radiculopathy, cervical region; and Cervical facet syndrome (Right) on their problem list. Her primarily concern today is the Neck Pain (right, "feels hard") and Shoulder Pain (right and armpit area on the right)  Pain Assessment: Location: Right Neck Radiating: right shoulder Onset: More than a month ago Duration: Chronic pain Quality: Constant, Discomfort Severity: 1 /10 (subjective, self-reported pain score)  Note: Reported level is compatible with observation.                         When using our objective Pain Scale, levels between 6 and 10/10 are said to belong in an emergency room, as it progressively worsens from a 6/10, described as severely limiting, requiring emergency care not usually available at an outpatient pain management facility. At a 6/10 level, communication becomes difficult and requires great effort. Assistance to reach the emergency department may be required. Facial flushing and profuse sweating along  with potentially dangerous increases in heart rate and blood pressure will be evident. Effect on ADL:   Timing: Constant Modifying factors: procedures, being still BP: (!) 155/94  HR: 78  Sabrina Castro comes in today for post-procedure evaluation.  Further details on  both, my assessment(s), as well as the proposed treatment plan, please see below.  Post-Procedure Assessment  03/02/2018 Procedure: Diagnostic right-sided cervical epidural steroid injection #2 under fluoroscopic guidance, no sedation Pre-procedure pain score:  2/10 Post-procedure pain score: 0/10 (100% relief) Influential Factors: BMI: 60.08 kg/m Intra-procedural challenges: None observed.         Assessment challenges: None detected.              Reported side-effects: None.        Post-procedural adverse reactions or complications: None reported         Sedation: No sedation used. When no sedatives are used, the analgesic levels obtained are directly associated to the effectiveness of the local anesthetics. However, when sedation is provided, the level of analgesia obtained during the initial 1 hour following the intervention, is believed to be the result of a combination of factors. These factors may include, but are not limited to: 1. The effectiveness of the local anesthetics used. 2. The effects of the analgesic(s) and/or anxiolytic(s) used. 3. The degree of discomfort experienced by the patient at the time of the procedure. 4. The patients ability and reliability in recalling and recording the events. 5. The presence and influence of possible secondary gains and/or psychosocial factors. Reported result: Relief experienced during the 1st hour after the procedure: 100 % (Ultra-Short Term Relief)            Interpretative annotation: Clinically appropriate result. No IV Analgesic or Anxiolytic given, therefore benefits are completely due to Local Anesthetic effects.          Effects of local anesthetic: The analgesic  effects attained during this period are directly associated to the localized infiltration of local anesthetics and therefore cary significant diagnostic value as to the etiological location, or anatomical origin, of the pain. Expected duration of relief is directly dependent on the pharmacodynamics of the local anesthetic used. Long-acting (4-6 hours) anesthetics used.  Reported result: Relief during the next 4 to 6 hour after the procedure: 100 % (Short-Term Relief)            Interpretative annotation: Clinically appropriate result. Analgesia during this period is likely to be Local Anesthetic-related.          Long-term benefit: Defined as the period of time past the expected duration of local anesthetics (1 hour for short-acting and 4-6 hours for long-acting). With the possible exception of prolonged sympathetic blockade from the local anesthetics, benefits during this period are typically attributed to, or associated with, other factors such as analgesic sensory neuropraxia, antiinflammatory effects, or beneficial biochemical changes provided by agents other than the local anesthetics.  Reported result: Extended relief following procedure: 90 %(for about 4 days, then gradually returned) (Long-Term Relief)            Interpretative annotation: Clinically possible results. Good relief. No permanent benefit expected. Inflammation plays a part in the etiology to the pain.          Current benefits: Defined as reported results that persistent at this point in time.   Analgesia: <50 %            Function: Somewhat improved ROM: Somewhat improved Interpretative annotation: Recurrence of symptoms. Incomplete therapeutic success. Results would suggest persistent aggravating factors.          Interpretation: Results would suggest a successful diagnostic intervention.                  Plan:  Re-assessment of algesic  etiology. At this point, the patient no longer has any radicular symptoms in the cervical  region.  This would suggest that the cervical epidural steroid injections have helped with regards to this.  However, she continues to have axial pain which I believe it to be secondary to her cervical facets as she has a 2 mm spondylolisthesis in her cervical spine.  She also demonstrates degenerative changes in the cervical facets.          Laboratory Chemistry  Inflammation Markers (CRP: Acute Phase) (ESR: Chronic Phase) Lab Results  Component Value Date   CRP 28.4 (H) 09/13/2017   ESRSEDRATE 50 (H) 09/13/2017                         Rheumatology Markers No results found.  Renal Markers Lab Results  Component Value Date   BUN 8 09/13/2017   CREATININE 0.90 09/13/2017   BCR 9 09/13/2017   GFRAA 83 09/13/2017   GFRNONAA 72 09/13/2017                             Hepatic Markers Lab Results  Component Value Date   AST 14 09/13/2017   ALBUMIN 4.2 09/13/2017                        Neuropathy Markers Lab Results  Component Value Date   VITAMINB12 >2000 (H) 09/13/2017                        Hematology Parameters Lab Results  Component Value Date   PLT 410 04/28/2017   HGB 11.7 (L) 04/28/2017   HCT 35.9 04/28/2017                        CV Markers Lab Results  Component Value Date   TROPONINI <0.03 04/28/2017                         Note: Lab results reviewed.  Recent Imaging Results   Results for orders placed in visit on 03/02/18  DG C-Arm 1-60 Min-No Report   Narrative Fluoroscopy was utilized by the requesting physician.  No radiographic  interpretation.    Interpretation Report: Fluoroscopy was used during the procedure to assist with needle guidance. The images were interpreted intraoperatively by the requesting physician.  Meds   Current Outpatient Medications:  .  amLODipine (NORVASC) 5 MG tablet, Take 2.5 mg by mouth daily. , Disp: , Rfl:  .  DULoxetine (CYMBALTA) 60 MG capsule, Take 60 mg by mouth daily., Disp: , Rfl:  .  ergocalciferol (VITAMIN  D2) 50000 units capsule, Take 50,000 Units by mouth once a week., Disp: , Rfl:  .  ferrous sulfate 325 (65 FE) MG tablet, Take 325 mg by mouth daily with breakfast., Disp: , Rfl:  .  gabapentin (NEURONTIN) 300 MG capsule, Take 1-3 capsules (300-900 mg total) by mouth 4 (four) times daily. Follow the written titration schedule., Disp: 360 capsule, Rfl: 2 .  metoprolol succinate (TOPROL-XL) 50 MG 24 hr tablet, Take 50 mg by mouth daily. Take with or immediately following a meal., Disp: , Rfl:  .  Multiple Vitamin (MULTIVITAMIN) tablet, Take 1 tablet by mouth daily., Disp: , Rfl:  .  olmesartan-hydrochlorothiazide (BENICAR HCT) 20-12.5 MG tablet, Take 1 tablet by mouth daily., Disp: ,  Rfl:  .  pantoprazole (PROTONIX) 40 MG tablet, Take 40 mg by mouth daily., Disp: , Rfl:  .  traMADol (ULTRAM) 50 MG tablet, Take 1-2 tablets (50-100 mg total) by mouth every 6 (six) hours as needed for severe pain., Disp: 240 tablet, Rfl: 2 .  traZODone (DESYREL) 50 MG tablet, Take 50 mg by mouth at bedtime., Disp: , Rfl:   ROS  Constitutional: Denies any fever or chills Gastrointestinal: No reported hemesis, hematochezia, vomiting, or acute GI distress Musculoskeletal: Denies any acute onset joint swelling, redness, loss of ROM, or weakness Neurological: No reported episodes of acute onset apraxia, aphasia, dysarthria, agnosia, amnesia, paralysis, loss of coordination, or loss of consciousness  Allergies  Ms. Rabadan is allergic to lisinopril.  [place]  Drug: Ms. Christiano  reports that she does not use drugs. Alcohol:  reports that she does not drink alcohol. Tobacco:  reports that she has never smoked. She has never used smokeless tobacco. Medical:  has a past medical history of Depression, GERD (gastroesophageal reflux disease), and Hypertension. Surgical: Ms. Kostka  has a past surgical history that includes Colonoscopy with propofol (N/A, 01/09/2016); Esophagogastroduodenoscopy (egd) with propofol (N/A, 01/09/2016);  and Cholecystectomy open. Family: family history includes Breast cancer in her maternal aunt.  Constitutional Exam  General appearance: Well nourished, well developed, and well hydrated. In no apparent acute distress Vitals:   03/27/18 1201 03/27/18 1205  BP:  (!) 155/94  Pulse:  78  Resp: 18 16  Temp: 98.7 F (37.1 C) 98.7 F (37.1 C)  TempSrc: Oral Oral  SpO2:  98%  Weight:  (!) 350 lb (158.8 kg)  Height:  _0  (1.626 m)   BMI Assessment: Estimated body mass index is 60.08 kg/m as calculated from the following:   Height as of this encounter: _1  (1.626 m).   Weight as of this encounter: 350 lb (158.8 kg).  BMI interpretation table: BMI level Category Range association with higher incidence of chronic pain  <18 kg/m2 Underweight   18.5-24.9 kg/m2 Ideal body weight   25-29.9 kg/m2 Overweight Increased incidence by 20%  30-34.9 kg/m2 Obese (Class I) Increased incidence by 68%  35-39.9 kg/m2 Severe obesity (Class II) Increased incidence by 136%  >40 kg/m2 Extreme obesity (Class III) Increased incidence by 254%   Patient's current BMI Ideal Body weight  Body mass index is 60.08 kg/m. Ideal body weight: 54.7 kg (120 lb 9.5 oz) Adjusted ideal body weight: 96.3 kg (212 lb 5.7 oz)   BMI Readings from Last 4 Encounters:  03/27/18 60.08 kg/m  03/02/18 57.50 kg/m  02/08/18 56.64 kg/m  01/19/18 57.50 kg/m   Wt Readings from Last 4 Encounters:  03/27/18 (!) 350 lb (158.8 kg)  03/02/18 (!) 335 lb (152 kg)  02/08/18 (!) 330 lb (149.7 kg)  01/19/18 (!) 335 lb (152 kg)  Psych/Mental status: Alert, oriented x 3 (person, place, & time)       Eyes: PERLA Respiratory: No evidence of acute respiratory distress  Cervical Spine Area Exam  Skin & Axial Inspection: No masses, redness, edema, swelling, or associated skin lesions Alignment: Symmetrical Functional ROM: Unrestricted ROM      Stability: No instability detected Muscle Tone/Strength: Functionally intact. No obvious  neuro-muscular anomalies detected. Sensory (Neurological): Unimpaired Palpation: No palpable anomalies              Upper Extremity (UE) Exam    Side: Right upper extremity  Side: Left upper extremity  Skin & Extremity Inspection: Skin color, temperature, and  hair growth are WNL. No peripheral edema or cyanosis. No masses, redness, swelling, asymmetry, or associated skin lesions. No contractures.  Skin & Extremity Inspection: Skin color, temperature, and hair growth are WNL. No peripheral edema or cyanosis. No masses, redness, swelling, asymmetry, or associated skin lesions. No contractures.  Functional ROM: Unrestricted ROM          Functional ROM: Unrestricted ROM          Muscle Tone/Strength: Functionally intact. No obvious neuro-muscular anomalies detected.  Muscle Tone/Strength: Functionally intact. No obvious neuro-muscular anomalies detected.  Sensory (Neurological): Unimpaired          Sensory (Neurological): Unimpaired          Palpation: No palpable anomalies              Palpation: No palpable anomalies              Provocative Test(s):  Phalen's test: deferred Tinel's test: deferred Apley's scratch test (touch opposite shoulder):  Action 1 (Across chest): deferred Action 2 (Overhead): deferred Action 3 (LB reach): deferred   Provocative Test(s):  Phalen's test: deferred Tinel's test: deferred Apley's scratch test (touch opposite shoulder):  Action 1 (Across chest): deferred Action 2 (Overhead): deferred Action 3 (LB reach): deferred    Thoracic Spine Area Exam  Skin & Axial Inspection: No masses, redness, or swelling Alignment: Symmetrical Functional ROM: Unrestricted ROM Stability: No instability detected Muscle Tone/Strength: Functionally intact. No obvious neuro-muscular anomalies detected. Sensory (Neurological): Unimpaired Muscle strength & Tone: No palpable anomalies  Lumbar Spine Area Exam  Skin & Axial Inspection: No masses, redness, or swelling Alignment:  Symmetrical Functional ROM: Restricted ROM       Stability: No instability detected Muscle Tone/Strength: Functionally intact. No obvious neuro-muscular anomalies detected. Sensory (Neurological): Unimpaired Palpation: No palpable anomalies       Provocative Tests: Hyperextension/rotation test: deferred today       Lumbar quadrant test (Kemp's test): deferred today       Lateral bending test: deferred today       Patrick's Maneuver: deferred today                   FABER test: deferred today                   S-I anterior distraction/compression test: deferred today         S-I lateral compression test: deferred today         S-I Thigh-thrust test: deferred today         S-I Gaenslen's test: deferred today          Gait & Posture Assessment  Ambulation: Patient came in today in a wheel chair.  However, his seems to be necessary, primarily due to her morbid obesity. Gait: Modified gait pattern (slower gait speed, wider stride width, and longer stance duration) associated with morbid obesity Posture: Antalgic   Lower Extremity Exam    Side: Right lower extremity  Side: Left lower extremity  Stability: No instability observed          Stability: No instability observed          Skin & Extremity Inspection: Skin color, temperature, and hair growth are WNL. No peripheral edema or cyanosis. No masses, redness, swelling, asymmetry, or associated skin lesions. No contractures.  Skin & Extremity Inspection: Skin color, temperature, and hair growth are WNL. No peripheral edema or cyanosis. No masses, redness, swelling, asymmetry, or associated skin lesions. No contractures.  Functional ROM: Unrestricted ROM                  Functional ROM: Unrestricted ROM                  Muscle Tone/Strength: Functionally intact. No obvious neuro-muscular anomalies detected.  Muscle Tone/Strength: Functionally intact. No obvious neuro-muscular anomalies detected.  Sensory (Neurological): Unimpaired  Sensory  (Neurological): Unimpaired  Palpation: No palpable anomalies  Palpation: No palpable anomalies   Assessment  Primary Diagnosis & Pertinent Problem List: The primary encounter diagnosis was Cervicalgia. Diagnoses of Cervical facet syndrome (Right), Spondylosis without myelopathy or radiculopathy, cervical region, DDD (degenerative disc disease), cervical, Chronic low back pain (Primary Area of Pain) (Bilateral) (L>R) w/o sciatica, Chronic hip pain (Secondary Area of Pain) (Bilateral) (L>R), Chronic lower extremity pain (Tertiary Area of Pain) (Bilateral) (L>R), and Chronic knee pain (Fourth Area of Pain) (Bilateral) (R>L) were also pertinent to this visit.  Status Diagnosis  Persistent Persistent Stable 1. Cervicalgia   2. Cervical facet syndrome (Right)   3. Spondylosis without myelopathy or radiculopathy, cervical region   4. DDD (degenerative disc disease), cervical   5. Chronic low back pain (Primary Area of Pain) (Bilateral) (L>R) w/o sciatica   6. Chronic hip pain (Secondary Area of Pain) (Bilateral) (L>R)   7. Chronic lower extremity pain (Tertiary Area of Pain) (Bilateral) (L>R)   8. Chronic knee pain (Fourth Area of Pain) (Bilateral) (R>L)     Problems updated and reviewed during this visit: Problem  Spondylosis Without Myelopathy Or Radiculopathy, Cervical Region  Cervical facet syndrome (Right)  Elevated Blood Sugar Level  Recurrent Major Depressive Disorder, in Partial Remission ([place])   Plan of Care  Pharmacotherapy (Medications Ordered): No orders of the defined types were placed in this encounter.  Medications administered today: Lydiana Menna had no medications administered during this visit.   Procedure Orders     CERVICAL FACET (MEDIAL BRANCH NERVE BLOCK)  Lab Orders  No laboratory test(s) ordered today   Imaging Orders  No imaging studies ordered today   Referral Orders  No referral(s) requested today   Interventional management options: Planned,  scheduled, and/or pending:   NOTE: NO RFA until she brings BMI down to 30-32. Diagnostic right-sided cervical facet block #1 under fluoroscopic guidance and IV sedation   Considering:   Diagnostic right-sided C7-T1 cervical epidural steroid injection #3  Diagnostic bilateral lumbar facet block #3 Possible bilateral lumbar facet RFA(Starting w/ left) (Needs to bring BMI down to 30-32 before proceeding.) Diagnostic bilateral sacroiliac joint block Possible bilateral sacroiliac joint RFA Diagnostic L2-3 LESI Diagnostic L3-4 LESI Diagnostic L4-5 LESI Diagnostic right-sided L2-3 transforaminalESI Diagnostic bilateral L4-5 transforaminal ESI Diagnostic bilateral intra-articular hip joint injection Diagnostic bilateral femoral nerve +obturatornerve block Possible bilateral femoral nerve +obturator nerve RFA Diagnostic bilateral intra-articular knee injectionwith local anesthetic and steroid  Possible bilateral, series of 5, intra-articular Hyalgan knee injections Diagnostic bilateral genicular nerve block Possible bilateral genicular nerve RFA   Palliative PRN treatment(s):   Palliative bilateral lumbar facet block   Provider-requested follow-up: Return for Procedure (w/ sedation): (R) C-FCT BLK #1.  Future Appointments  Date Time Provider [place]  04/06/2018  8:30 AM Milinda Pointer, MD [facility] None  05/29/2018  9:45 AM Vevelyn Francois, NP [facility] None   Primary Care Physician: Tracie Harrier, MD Location: [facility] Note by: Gaspar Cola, MD Date: 03/27/2018; Time: 2:24 PM

## 2018-03-27 ENCOUNTER — Encounter: Payer: Self-pay | Admitting: Pain Medicine

## 2018-03-27 ENCOUNTER — Ambulatory Visit: Payer: BLUE CROSS/BLUE SHIELD | Attending: Pain Medicine | Admitting: Pain Medicine

## 2018-03-27 ENCOUNTER — Other Ambulatory Visit: Payer: Self-pay

## 2018-03-27 VITALS — BP 155/94 | HR 78 | Temp 98.7°F | Resp 16 | Ht 64.0 in | Wt 350.0 lb

## 2018-03-27 DIAGNOSIS — Z79891 Long term (current) use of opiate analgesic: Secondary | ICD-10-CM | POA: Diagnosis not present

## 2018-03-27 DIAGNOSIS — M25562 Pain in left knee: Secondary | ICD-10-CM

## 2018-03-27 DIAGNOSIS — M47812 Spondylosis without myelopathy or radiculopathy, cervical region: Secondary | ICD-10-CM | POA: Diagnosis not present

## 2018-03-27 DIAGNOSIS — M533 Sacrococcygeal disorders, not elsewhere classified: Secondary | ICD-10-CM | POA: Insufficient documentation

## 2018-03-27 DIAGNOSIS — M160 Bilateral primary osteoarthritis of hip: Secondary | ICD-10-CM | POA: Insufficient documentation

## 2018-03-27 DIAGNOSIS — M25551 Pain in right hip: Secondary | ICD-10-CM | POA: Insufficient documentation

## 2018-03-27 DIAGNOSIS — M79604 Pain in right leg: Secondary | ICD-10-CM | POA: Diagnosis not present

## 2018-03-27 DIAGNOSIS — Z6841 Body Mass Index (BMI) 40.0 and over, adult: Secondary | ICD-10-CM | POA: Insufficient documentation

## 2018-03-27 DIAGNOSIS — G8929 Other chronic pain: Secondary | ICD-10-CM | POA: Insufficient documentation

## 2018-03-27 DIAGNOSIS — K219 Gastro-esophageal reflux disease without esophagitis: Secondary | ICD-10-CM | POA: Diagnosis not present

## 2018-03-27 DIAGNOSIS — M545 Low back pain, unspecified: Secondary | ICD-10-CM

## 2018-03-27 DIAGNOSIS — M170 Bilateral primary osteoarthritis of knee: Secondary | ICD-10-CM | POA: Diagnosis not present

## 2018-03-27 DIAGNOSIS — Z79899 Other long term (current) drug therapy: Secondary | ICD-10-CM | POA: Insufficient documentation

## 2018-03-27 DIAGNOSIS — M79605 Pain in left leg: Secondary | ICD-10-CM | POA: Insufficient documentation

## 2018-03-27 DIAGNOSIS — D649 Anemia, unspecified: Secondary | ICD-10-CM | POA: Diagnosis not present

## 2018-03-27 DIAGNOSIS — I10 Essential (primary) hypertension: Secondary | ICD-10-CM | POA: Diagnosis not present

## 2018-03-27 DIAGNOSIS — M25511 Pain in right shoulder: Secondary | ICD-10-CM | POA: Insufficient documentation

## 2018-03-27 DIAGNOSIS — G4733 Obstructive sleep apnea (adult) (pediatric): Secondary | ICD-10-CM | POA: Diagnosis not present

## 2018-03-27 DIAGNOSIS — M25561 Pain in right knee: Secondary | ICD-10-CM | POA: Diagnosis not present

## 2018-03-27 DIAGNOSIS — M542 Cervicalgia: Secondary | ICD-10-CM

## 2018-03-27 DIAGNOSIS — M5136 Other intervertebral disc degeneration, lumbar region: Secondary | ICD-10-CM | POA: Insufficient documentation

## 2018-03-27 DIAGNOSIS — M48061 Spinal stenosis, lumbar region without neurogenic claudication: Secondary | ICD-10-CM | POA: Insufficient documentation

## 2018-03-27 DIAGNOSIS — J0100 Acute maxillary sinusitis, unspecified: Secondary | ICD-10-CM | POA: Diagnosis not present

## 2018-03-27 DIAGNOSIS — F3341 Major depressive disorder, recurrent, in partial remission: Secondary | ICD-10-CM | POA: Insufficient documentation

## 2018-03-27 DIAGNOSIS — E559 Vitamin D deficiency, unspecified: Secondary | ICD-10-CM | POA: Diagnosis not present

## 2018-03-27 DIAGNOSIS — M5030 Other cervical disc degeneration, unspecified cervical region: Secondary | ICD-10-CM | POA: Insufficient documentation

## 2018-03-27 DIAGNOSIS — M25552 Pain in left hip: Secondary | ICD-10-CM | POA: Diagnosis not present

## 2018-03-27 DIAGNOSIS — Z888 Allergy status to other drugs, medicaments and biological substances status: Secondary | ICD-10-CM | POA: Insufficient documentation

## 2018-03-27 DIAGNOSIS — E1165 Type 2 diabetes mellitus with hyperglycemia: Secondary | ICD-10-CM | POA: Diagnosis not present

## 2018-03-27 NOTE — Progress Notes (Signed)
Nursing Pain Medication Assessment:  Safety precautions to be maintained throughout the outpatient stay will include: orient to surroundings, keep bed in low position, maintain call bell within reach at all times, provide assistance with transfer out of bed and ambulation.  Medication Inspection Compliance: Ms. Parrella did not comply with our request to bring her pills to be counted. She was reminded that bringing the medication bottles, even when empty, is a requirement.  Medication: None brought in. Pill/Patch Count: None available to be counted. Bottle Appearance: No container available. Did not bring bottle(s) to appointment. Filled Date: N/A Last Medication intake:  Today

## 2018-03-27 NOTE — Patient Instructions (Signed)
____________________________________________________________________________________________  Preparing for Procedure with Sedation  Instructions: . Oral Intake: Do not eat or drink anything for at least 8 hours prior to your procedure. . Transportation: Public transportation is not allowed. Bring an adult driver. The driver must be physically present in our waiting room before any procedure can be started. . Physical Assistance: Bring an adult physically capable of assisting you, in the event you need help. This adult should keep you company at home for at least 6 hours after the procedure. . Blood Pressure Medicine: Take your blood pressure medicine with a sip of water the morning of the procedure. . Blood thinners: Notify our staff if you are taking any blood thinners. Depending on which one you take, there will be specific instructions on how and when to stop it. . Diabetics on insulin: Notify the staff so that you can be scheduled 1st case in the morning. If your diabetes requires high dose insulin, take only  of your normal insulin dose the morning of the procedure and notify the staff that you have done so. . Preventing infections: Shower with an antibacterial soap the morning of your procedure. . Build-up your immune system: Take 1000 mg of Vitamin C with every meal (3 times a day) the day prior to your procedure. . Antibiotics: Inform the staff if you have a condition or reason that requires you to take antibiotics before dental procedures. . Pregnancy: If you are pregnant, call and cancel the procedure. . Sickness: If you have a cold, fever, or any active infections, call and cancel the procedure. . Arrival: You must be in the facility at least 30 minutes prior to your scheduled procedure. . Children: Do not bring children with you. . Dress appropriately: Bring dark clothing that you would not mind if they get stained. . Valuables: Do not bring any jewelry or valuables.  Procedure  appointments are reserved for interventional treatments only. . No Prescription Refills. . No medication changes will be discussed during procedure appointments. . No disability issues will be discussed.  Reasons to call and reschedule or cancel your procedure: (Following these recommendations will minimize the risk of a serious complication.) . Surgeries: Avoid having procedures within 2 weeks of any surgery. (Avoid for 2 weeks before or after any surgery). . Flu Shots: Avoid having procedures within 2 weeks of a flu shots or . (Avoid for 2 weeks before or after immunizations). . Barium: Avoid having a procedure within 7-10 days after having had a radiological study involving the use of radiological contrast. (Myelograms, Barium swallow or enema study). . Heart attacks: Avoid any elective procedures or surgeries for the initial 6 months after a "Myocardial Infarction" (Heart Attack). . Blood thinners: It is imperative that you stop these medications before procedures. Let us know if you if you take any blood thinner.  . Infection: Avoid procedures during or within two weeks of an infection (including chest colds or gastrointestinal problems). Symptoms associated with infections include: Localized redness, fever, chills, night sweats or profuse sweating, burning sensation when voiding, cough, congestion, stuffiness, runny nose, sore throat, diarrhea, nausea, vomiting, cold or Flu symptoms, recent or current infections. It is specially important if the infection is over the area that we intend to treat. . Heart and lung problems: Symptoms that may suggest an active cardiopulmonary problem include: cough, chest pain, breathing difficulties or shortness of breath, dizziness, ankle swelling, uncontrolled high or unusually low blood pressure, and/or palpitations. If you are experiencing any of these symptoms, cancel   your procedure and contact your primary care physician for an evaluation.  Remember:   Regular Business hours are:  Monday to Thursday 8:00 AM to 4:00 PM  Provider's Schedule: Jaxsen Bernhart, MD:  Procedure days: Tuesday and Thursday 7:30 AM to 4:00 PM  Bilal Lateef, MD:  Procedure days: Monday and Wednesday 7:30 AM to 4:00 PM ____________________________________________________________________________________________    

## 2018-04-06 ENCOUNTER — Ambulatory Visit
Admission: RE | Admit: 2018-04-06 | Discharge: 2018-04-06 | Disposition: A | Discharge status: Home / Self Care | Payer: BLUE CROSS/BLUE SHIELD | Source: Ambulatory Visit | Attending: Pain Medicine | Admitting: Pain Medicine

## 2018-04-06 ENCOUNTER — Encounter: Payer: Self-pay | Admitting: Pain Medicine

## 2018-04-06 ENCOUNTER — Ambulatory Visit (HOSPITAL_BASED_OUTPATIENT_CLINIC_OR_DEPARTMENT_OTHER): Payer: BLUE CROSS/BLUE SHIELD | Admitting: Pain Medicine

## 2018-04-06 ENCOUNTER — Other Ambulatory Visit: Payer: Self-pay

## 2018-04-06 VITALS — BP 130/74 | HR 72 | Temp 97.6°F | Resp 16 | Ht 64.0 in | Wt 360.0 lb

## 2018-04-06 DIAGNOSIS — M47892 Other spondylosis, cervical region: Secondary | ICD-10-CM | POA: Diagnosis not present

## 2018-04-06 DIAGNOSIS — Z888 Allergy status to other drugs, medicaments and biological substances status: Secondary | ICD-10-CM | POA: Diagnosis not present

## 2018-04-06 DIAGNOSIS — M5030 Other cervical disc degeneration, unspecified cervical region: Secondary | ICD-10-CM | POA: Diagnosis not present

## 2018-04-06 DIAGNOSIS — Z9049 Acquired absence of other specified parts of digestive tract: Secondary | ICD-10-CM | POA: Insufficient documentation

## 2018-04-06 DIAGNOSIS — M542 Cervicalgia: Secondary | ICD-10-CM

## 2018-04-06 DIAGNOSIS — M47812 Spondylosis without myelopathy or radiculopathy, cervical region: Secondary | ICD-10-CM

## 2018-04-06 DIAGNOSIS — Z79899 Other long term (current) drug therapy: Secondary | ICD-10-CM | POA: Diagnosis not present

## 2018-04-06 MED ORDER — LIDOCAINE HCL 2 % IJ SOLN
20.0000 mL | Freq: Once | INTRAMUSCULAR | Status: AC
  Administered 2018-04-06: 400 mg
  Filled 2018-04-06: qty 40

## 2018-04-06 MED ORDER — LACTATED RINGERS IV SOLN: 1000.0000 mL | Freq: Once | INTRAVENOUS | Status: DC

## 2018-04-06 MED ORDER — ROPIVACAINE HCL 2 MG/ML IJ SOLN
9.0000 mL | Freq: Once | INTRAMUSCULAR | Status: AC
  Administered 2018-04-06: 9 mL via PERINEURAL
  Filled 2018-04-06: qty 10

## 2018-04-06 MED ORDER — FENTANYL CITRATE (PF) 100 MCG/2ML IJ SOLN
25.0000 ug | INTRAMUSCULAR | Status: DC | PRN
  Administered 2018-04-06: 50 ug via INTRAVENOUS
  Filled 2018-04-06: qty 2

## 2018-04-06 MED ORDER — MIDAZOLAM HCL 5 MG/5ML IJ SOLN
1.0000 mg | INTRAMUSCULAR | Status: DC | PRN
  Administered 2018-04-06: 2 mg via INTRAVENOUS
  Filled 2018-04-06: qty 5

## 2018-04-06 MED ORDER — DEXAMETHASONE SODIUM PHOSPHATE 10 MG/ML IJ SOLN
10.0000 mg | Freq: Once | INTRAMUSCULAR | Status: AC
  Administered 2018-04-06: 10 mg
  Filled 2018-04-06: qty 1

## 2018-04-06 NOTE — Progress Notes (Signed)
Patient's Name: Sabrina Castro  MRN: [id number]  Referring Provider: Tracie Harrier, MD  DOB: [birth year]  PCP: Tracie Harrier, MD  DOS: 04/06/2018  Note by: Gaspar Cola, MD  Service setting: Ambulatory outpatient  Specialty: Interventional Pain Management  Patient type: Established  Location: [facility] (AMB) Pain Management Facility  Visit type: Interventional Procedure   Primary Reason for Visit: Interventional Pain Management Treatment. CC: Neck Pain  Procedure:          Anesthesia, Analgesia, Anxiolysis:  Type: Cervical Facet Medial Branch Block(s)  #1  Primary Purpose: Diagnostic Region: Posterolateral cervical spine Level: C3, C4, C5, C6, & C7 Medial Branch Level(s). Injecting these levels blocks the C3-4, C4-5, C5-6, and C6-7 cervical facet joints. Laterality: Right  Type: Moderate (Conscious) Sedation combined with Local Anesthesia Indication(s): Analgesia and Anxiety Route: Intravenous (IV) IV Access: Secured Sedation: Meaningful verbal contact was maintained at all times during the procedure  Local Anesthetic: Lidocaine 1-2%  Position: Prone with head of the table raised to facilitate breathing.   Indications: 1. Spondylosis without myelopathy or radiculopathy, cervical region   2. Cervical facet syndrome (Right)   3. Cervicalgia   4. DDD (degenerative disc disease), cervical    Pain Score: Pre-procedure: 0-No pain/10 Post-procedure: 0-No pain/10  Pre-op Assessment:  Sabrina Castro is a 57 y.o. (year old), female patient, seen today for interventional treatment. She  has a past surgical history that includes Colonoscopy with propofol (N/A, 01/09/2016); Esophagogastroduodenoscopy (egd) with propofol (N/A, 01/09/2016); and Cholecystectomy open. Sabrina Castro has a current medication list which includes the following prescription(s): amlodipine, duloxetine, ergocalciferol, ferrous sulfate, gabapentin, metoprolol succinate, multivitamin, olmesartan-hydrochlorothiazide,  pantoprazole, tramadol, and trazodone, and the following Facility-Administered Medications: fentanyl, lactated ringers, and midazolam. Her primarily concern today is the Neck Pain  Initial Vital Signs:  Pulse/HCG Rate: 72ECG Heart Rate: 79 Temp: 98.2 F (36.8 C) Resp: (!) 26 BP: (!) 126/50 SpO2: 100 %  BMI: Estimated body mass index is 61.79 kg/m as calculated from the following:   Height as of this encounter: 5\' 4"  (1.626 m).   Weight as of this encounter: 360 lb (163.3 kg).  Risk Assessment: Allergies: Reviewed. She is allergic to lisinopril.  Allergy Precautions: None required Coagulopathies: Reviewed. None identified.  Blood-thinner therapy: None at this time Active Infection(s): Reviewed. None identified. Sabrina Castro is afebrile  Site Confirmation: Sabrina Castro was asked to confirm the procedure and laterality before marking the site Procedure checklist: Completed Consent: Before the procedure and under the influence of no sedative(s), amnesic(s), or anxiolytics, the patient was informed of the treatment options, risks and possible complications. To fulfill our ethical and legal obligations, as recommended by the American Medical Association's Code of Ethics, I have informed the patient of my clinical impression; the nature and purpose of the treatment or procedure; the risks, benefits, and possible complications of the intervention; the alternatives, including doing nothing; the risk(s) and benefit(s) of the alternative treatment(s) or procedure(s); and the risk(s) and benefit(s) of doing nothing. The patient was provided information about the general risks and possible complications associated with the procedure. These may include, but are not limited to: failure to achieve desired goals, infection, bleeding, organ or nerve damage, allergic reactions, paralysis, and death. In addition, the patient was informed of those risks and complications associated to Spine-related procedures, such  as failure to decrease pain; infection (i.e.: Meningitis, epidural or intraspinal abscess); bleeding (i.e.: epidural hematoma, subarachnoid hemorrhage, or any other type of intraspinal or peri-dural bleeding); organ or nerve damage (i.e.:  Any type of peripheral nerve, nerve root, or spinal cord injury) with subsequent damage to sensory, motor, and/or autonomic systems, resulting in permanent pain, numbness, and/or weakness of one or several areas of the body; allergic reactions; (i.e.: anaphylactic reaction); and/or death. Furthermore, the patient was informed of those risks and complications associated with the medications. These include, but are not limited to: allergic reactions (i.e.: anaphylactic or anaphylactoid reaction(s)); adrenal axis suppression; blood sugar elevation that in diabetics may result in ketoacidosis or comma; water retention that in patients with history of congestive heart failure may result in shortness of breath, pulmonary edema, and decompensation with resultant heart failure; weight gain; swelling or edema; medication-induced neural toxicity; particulate matter embolism and blood vessel occlusion with resultant organ, and/or nervous system infarction; and/or aseptic necrosis of one or more joints. Finally, the patient was informed that Medicine is not an exact science; therefore, there is also the possibility of unforeseen or unpredictable risks and/or possible complications that may result in a catastrophic outcome. The patient indicated having understood very clearly. We have given the patient no guarantees and we have made no promises. Enough time was given to the patient to ask questions, all of which were answered to the patient's satisfaction. Sabrina Castro has indicated that she wanted to continue with the procedure. Attestation: I, the ordering provider, attest that I have discussed with the patient the benefits, risks, side-effects, alternatives, likelihood of achieving goals,  and potential problems during recovery for the procedure that I have provided informed consent. Date  Time: 04/06/2018  8:31 AM  Pre-Procedure Preparation:  Monitoring: As per clinic protocol. Respiration, ETCO2, SpO2, BP, heart rate and rhythm monitor placed and checked for adequate function Safety Precautions: Patient was assessed for positional comfort and pressure points before starting the procedure. Time-out: I initiated and conducted the "Time-out" before starting the procedure, as per protocol. The patient was asked to participate by confirming the accuracy of the "Time Out" information. Verification of the correct person, site, and procedure were performed and confirmed by me, the nursing staff, and the patient. "Time-out" conducted as per Joint Commission's Universal Protocol (UP.01.01.01). Time: 0936  Description of Procedure:          Laterality: Right Level: C3, C4, C5, C6, & C7 Medial Branch Level(s). Area Prepped: Posterior Cervico-thoracic Region Prepping solution: ChloraPrep (2% chlorhexidine gluconate and 70% isopropyl alcohol) Safety Precautions: Aspiration looking for blood return was conducted prior to all injections. At no point did we inject any substances, as a needle was being advanced. Before injecting, the patient was told to immediately notify me if she was experiencing any new onset of "ringing in the ears, or metallic taste in the mouth". No attempts were made at seeking any paresthesias. Safe injection practices and needle disposal techniques used. Medications properly checked for expiration dates. SDV (single dose vial) medications used. After the completion of the procedure, all disposable equipment used was discarded in the proper designated medical waste containers. Local Anesthesia: Protocol guidelines were followed. The patient was positioned over the fluoroscopy table. The area was prepped in the usual manner. The time-out was completed. The target area was  identified using fluoroscopy. A 12-in long, straight, sterile hemostat was used with fluoroscopic guidance to locate the targets for each level blocked. Once located, the skin was marked with an approved surgical skin marker. Once all sites were marked, the skin (epidermis, dermis, and hypodermis), as well as deeper tissues (fat, connective tissue and muscle) were infiltrated with a small  amount of a short-acting local anesthetic, loaded on a 10cc syringe with a 25G, 1.5-in  Needle. An appropriate amount of time was allowed for local anesthetics to take effect before proceeding to the next step. Local Anesthetic: Lidocaine 2.0% The unused portion of the local anesthetic was discarded in the proper designated containers. Technical explanation of process:  C3 Medial Branch Nerve Block (MBB): The target area for the C3 dorsal medial articular branch is the lateral concave waist of the articular pillar of C3. Under fluoroscopic guidance, a Quincke needle was inserted until contact was made with os over the postero-lateral aspect of the articular pillar of C3 (target area). After negative aspiration for blood, 0.5 mL of the nerve block solution was injected without difficulty or complication. The needle was removed intact. C4 Medial Branch Nerve Block (MBB): The target area for the C4 dorsal medial articular branch is the lateral concave waist of the articular pillar of C4. Under fluoroscopic guidance, a Quincke needle was inserted until contact was made with os over the postero-lateral aspect of the articular pillar of C4 (target area). After negative aspiration for blood, 0.5 mL of the nerve block solution was injected without difficulty or complication. The needle was removed intact. C5 Medial Branch Nerve Block (MBB): The target area for the C5 dorsal medial articular branch is the lateral concave waist of the articular pillar of C5. Under fluoroscopic guidance, a Quincke needle was inserted until contact was  made with os over the postero-lateral aspect of the articular pillar of C5 (target area). After negative aspiration for blood, 0.5 mL of the nerve block solution was injected without difficulty or complication. The needle was removed intact. C6 Medial Branch Nerve Block (MBB): The target area for the C6 dorsal medial articular branch is the lateral concave waist of the articular pillar of C6. Under fluoroscopic guidance, a Quincke needle was inserted until contact was made with os over the postero-lateral aspect of the articular pillar of C6 (target area). After negative aspiration for blood, 0.5 mL of the nerve block solution was injected without difficulty or complication. The needle was removed intact. C7 Medial Branch Nerve Block (MBB): The target for the C7 dorsal medial articular branch lies on the superior-medial tip of the C7 transverse process. Under fluoroscopic guidance, a Quincke needle was inserted until contact was made with os over the postero-lateral aspect of the articular pillar of C7 (target area). After negative aspiration for blood, 0.5 mL of the nerve block solution was injected without difficulty or complication. The needle was removed intact. Procedural Needles: 22-gauge, 3.5-inch, Quincke needles used for all levels. Nerve block solution: 0.2% PF-Ropivacaine + Triamcinolone (40 mg/mL) diluted to a final concentration of 4 mg of Triamcinolone/mL of Ropivacaine The unused portion of the solution was discarded in the proper designated containers.  Once the entire procedure was completed, the treated area was cleaned, making sure to leave some of the prepping solution back to take advantage of its long term bactericidal properties.  Vitals:   04/06/18 0948 04/06/18 0958 04/06/18 1008 04/06/18 1018  BP: 135/80 120/64 132/77 130/74  Pulse:      Resp: 11 16 17 16   Temp:  (!) 97.3 F (36.3 C)  97.6 F (36.4 C)  SpO2: 97% 100% 100% 100%  Weight:      Height:        Start Time:  0936 hrs. End Time: 0947 hrs.  Imaging Guidance (Spinal):          Type  of Imaging Technique: Fluoroscopy Guidance (Spinal) Indication(s): Assistance in needle guidance and placement for procedures requiring needle placement in or near specific anatomical locations not easily accessible without such assistance. Exposure Time: Please see nurses notes. Contrast: None used. Fluoroscopic Guidance: I was personally present during the use of fluoroscopy. "Tunnel Vision Technique" used to obtain the best possible view of the target area. Parallax error corrected before commencing the procedure. "Direction-depth-direction" technique used to introduce the needle under continuous pulsed fluoroscopy. Once target was reached, antero-posterior, oblique, and lateral fluoroscopic projection used confirm needle placement in all planes. Images permanently stored in EMR. Interpretation: No contrast injected. I personally interpreted the imaging intraoperatively. Adequate needle placement confirmed in multiple planes. Permanent images saved into the patient's record.  Antibiotic Prophylaxis:   Anti-infectives (From admission, onward)   None     Indication(s): None identified  Post-operative Assessment:  Post-procedure Vital Signs:  Pulse/HCG Rate: 7278 Temp: 97.6 F (36.4 C) Resp: 16 BP: 130/74 SpO2: 100 %  EBL: None  Complications: No immediate post-treatment complications observed by team, or reported by patient.  Note: The patient tolerated the entire procedure well. A repeat set of vitals were taken after the procedure and the patient was kept under observation following institutional policy, for this type of procedure. Post-procedural neurological assessment was performed, showing return to baseline, prior to discharge. The patient was provided with post-procedure discharge instructions, including a section on how to identify potential problems. Should any problems arise concerning this procedure,  the patient was given instructions to immediately contact us, at any time, without hesitation. In any case, we plan to contact the patient by telephone for a follow-up status report regarding this interventional procedure.  Comments:  No additional relevant information.  Plan of Care    Imaging Orders     DG C-Arm 1-60 Min-No Report  Procedure Orders     CERVICAL FACET (MEDIAL BRANCH NERVE BLOCK)   Medications ordered for procedure: Meds ordered this encounter  Medications  . lidocaine (XYLOCAINE) 2 % (with pres) injection 400 mg  . midazolam (VERSED) 5 MG/5ML injection 1-2 mg    Make sure Flumazenil is available in the pyxis when using this medication. If oversedation occurs, administer 0.2 mg IV over 15 sec. If after 45 sec no response, administer 0.2 mg again over 1 min; may repeat at 1 min intervals; not to exceed 4 doses (1 mg)  . fentaNYL (SUBLIMAZE) injection 25-50 mcg    Make sure Narcan is available in the pyxis when using this medication. In the event of respiratory depression (RR< 8/min): Titrate NARCAN (naloxone) in increments of 0.1 to 0.2 mg IV at 2-3 minute intervals, until desired degree of reversal.  . lactated ringers infusion 1,000 mL  . ropivacaine (PF) 2 mg/mL (0.2%) (NAROPIN) injection 9 mL  . dexamethasone (DECADRON) injection 10 mg   Medications administered: We administered lidocaine, midazolam, fentaNYL, ropivacaine (PF) 2 mg/mL (0.2%), and dexamethasone.  See the medical record for exact dosing, route, and time of administration.  Disposition: Discharge home  Discharge Date & Time: 04/06/2018; 1019 hrs.   Physician-requested Follow-up: Return for post-procedure eval (2 wks), w/ Dr. Dossie Arbour.  Future Appointments  Date Time Provider [place]  04/26/2018  2:00 PM Milinda Pointer, MD [facility] None  05/29/2018  9:45 AM Vevelyn Francois, NP [facility] None   Primary Care Physician: Tracie Harrier, MD Location: [facility]  Facility Note by: Gaspar Cola, MD Date: 04/06/2018; Time: 11:51 AM  Disclaimer:  Medicine is not an Chief Strategy Officer. The only guarantee in medicine is that nothing is guaranteed. It is important to note that the decision to proceed with this intervention was based on the information collected from the patient. The Data and conclusions were drawn from the patient's questionnaire, the interview, and the physical examination. Because the information was provided in large part by the patient, it cannot be guaranteed that it has not been purposely or unconsciously manipulated. Every effort has been made to obtain as much relevant data as possible for this evaluation. It is important to note that the conclusions that lead to this procedure are derived in large part from the available data. Always take into account that the treatment will also be dependent on availability of resources and existing treatment guidelines, considered by other Pain Management Practitioners as being common knowledge and practice, at the time of the intervention. For Medico-Legal purposes, it is also important to point out that variation in procedural techniques and pharmacological choices are the acceptable norm. The indications, contraindications, technique, and results of the above procedure should only be interpreted and judged by a Board-Certified Interventional Pain Specialist with extensive familiarity and expertise in the same exact procedure and technique.

## 2018-04-06 NOTE — Patient Instructions (Signed)

## 2018-04-07 ENCOUNTER — Telehealth: Payer: Self-pay

## 2018-04-07 NOTE — Telephone Encounter (Signed)
Post procedure phone call.  Patient states she is doing well.  

## 2018-04-26 ENCOUNTER — Encounter: Payer: Self-pay | Admitting: Pain Medicine

## 2018-04-26 ENCOUNTER — Ambulatory Visit: Payer: BLUE CROSS/BLUE SHIELD | Attending: Pain Medicine | Admitting: Pain Medicine

## 2018-04-26 ENCOUNTER — Other Ambulatory Visit: Payer: Self-pay

## 2018-04-26 VITALS — BP 123/78 | HR 82 | Temp 97.9°F | Wt 359.0 lb

## 2018-04-26 DIAGNOSIS — M5010 Cervical disc disorder with radiculopathy, unspecified cervical region: Secondary | ICD-10-CM | POA: Insufficient documentation

## 2018-04-26 DIAGNOSIS — K219 Gastro-esophageal reflux disease without esophagitis: Secondary | ICD-10-CM | POA: Diagnosis not present

## 2018-04-26 DIAGNOSIS — M47812 Spondylosis without myelopathy or radiculopathy, cervical region: Secondary | ICD-10-CM

## 2018-04-26 DIAGNOSIS — Z79891 Long term (current) use of opiate analgesic: Secondary | ICD-10-CM | POA: Insufficient documentation

## 2018-04-26 DIAGNOSIS — M5412 Radiculopathy, cervical region: Secondary | ICD-10-CM | POA: Diagnosis not present

## 2018-04-26 DIAGNOSIS — Z888 Allergy status to other drugs, medicaments and biological substances status: Secondary | ICD-10-CM | POA: Insufficient documentation

## 2018-04-26 DIAGNOSIS — I10 Essential (primary) hypertension: Secondary | ICD-10-CM | POA: Insufficient documentation

## 2018-04-26 DIAGNOSIS — G894 Chronic pain syndrome: Secondary | ICD-10-CM | POA: Diagnosis not present

## 2018-04-26 DIAGNOSIS — Z9049 Acquired absence of other specified parts of digestive tract: Secondary | ICD-10-CM | POA: Insufficient documentation

## 2018-04-26 DIAGNOSIS — M542 Cervicalgia: Secondary | ICD-10-CM | POA: Diagnosis present

## 2018-04-26 DIAGNOSIS — Z6841 Body Mass Index (BMI) 40.0 and over, adult: Secondary | ICD-10-CM | POA: Diagnosis not present

## 2018-04-26 DIAGNOSIS — Z79899 Other long term (current) drug therapy: Secondary | ICD-10-CM | POA: Diagnosis not present

## 2018-04-26 DIAGNOSIS — M5030 Other cervical disc degeneration, unspecified cervical region: Secondary | ICD-10-CM

## 2018-04-26 NOTE — Progress Notes (Signed)
Patient's Name: Sabrina Castro  MRN: [id number]  Referring Provider: Tracie Harrier, MD  DOB: [birth year]  PCP: Tracie Harrier, MD  DOS: 04/26/2018  Note by: Gaspar Cola, MD  Service setting: Ambulatory outpatient  Specialty: Interventional Pain Management  Location: [facility] (AMB) Pain Management Facility    Patient type: Established   Primary Reason(s) for Visit: Encounter for post-procedure evaluation of chronic illness with mild to moderate exacerbation CC: Other (no pain)  HPI  Sabrina Castro is a 57 y.o. year old, female patient, who comes today for a post-procedure evaluation. She has Acute maxillary sinusitis, unspecified; Allergic rhinitis; Anemia; DDD (degenerative disc disease), lumbar; Depression; Elevated erythrocyte sedimentation rate; Encounter for other general examination; Encounter for screening mammogram for malignant neoplasm of breast; Essential (primary) hypertension; Generalized osteoarthritis of multiple sites; GERD (gastroesophageal reflux disease); History of tachycardia; Morbid obesity with BMI of 50.0-59.9, adult ([place]); Obesity, unspecified; Numbness and tingling of foot; Obstructive sleep apnea; Other fecal abnormalities; Other general symptoms and signs; Other specified anxiety disorders; Pain in right knee; Polyneuropathy; Prediabetes; Tachycardia; Type 2 diabetes mellitus without complication, without long-term current use of insulin ([place]); Vitamin D deficiency, unspecified; Morbid obesity with BMI of 40.0-44.9, adult ([place]); Chronic low back pain (Primary Area of Pain) (Bilateral) (Midline) (L>R) w/ sciatica (Bilateral); Chronic hip pain (Secondary Area of Pain) (Bilateral) (L>R); Chronic lower extremity pain (Tertiary Area of Pain) (Bilateral) (L>R); Chronic knee pain (Fourth Area of Pain) (Bilateral) (R>L); Chronic pain syndrome; Long term current use of opiate analgesic; Pharmacologic therapy; Disorder of skeletal system; Problems influencing health status; Chronic  sacroiliac joint pain; Anterolisthesis (L2-3 and L4-5); Lumbar facet arthropathy (L2-3, L3-4, L4-5) (Bilateral); Lumbar foraminal stenosis (Right: L2-3) (Bilateral: L4-5); Lumbar lateral recess stenosis (Right: L2-3) (Bilateral: L4-5); Abnormal MRI, lumbar spine; Osteoarthritis of hips (Bilateral); Osteoarthritis of knees (Bilateral); Spondylosis without myelopathy or radiculopathy, lumbosacral region; Other specified dorsopathies, sacral and sacrococcygeal region; Elevated C-reactive protein (CRP); Neurogenic pain; Lumbar spine instability; Lumbar facet syndrome (Bilateral) (L>R); Chronic low back pain (Primary Area of Pain) (Bilateral) (L>R) w/o sciatica; Cervicalgia (Right); Myofascial pain; Abnormal x-ray of cervical spine; DDD (degenerative disc disease), cervical; Cervical radiculitis; Elevated blood sugar level; Recurrent major depressive disorder, in partial remission ([place]); Spondylosis without myelopathy or radiculopathy, cervical region; and Cervical facet syndrome (Right) on their problem list. Her primarily concern today is the Other (no pain)  Pain Assessment: Severity: 0-No pain/10 (subjective, self-reported pain score)  Note: Reported level is compatible with observation.                               Modifying factors: procedures BP: 123/78  HR: 82  Sabrina Castro comes in today for post-procedure evaluation.  Further details on both, my assessment(s), as well as the proposed treatment plan, please see below.  Post-Procedure Assessment  04/06/2018 Procedure: Diagnostic right-sided Cervical facet block #1 under fluoroscopic guidance and IV sedation Pre-procedure pain score:  0/10 Post-procedure pain score: 0/10 (100% relief) Influential Factors: BMI: 61.62 kg/m Intra-procedural challenges: None observed.         Assessment challenges: None detected.              Reported side-effects: None.        Post-procedural adverse reactions or complications: None reported         Sedation:  Sedation provided. When no sedatives are used, the analgesic levels obtained are directly associated to the effectiveness of the local anesthetics. However, when sedation  is provided, the level of analgesia obtained during the initial 1 hour following the intervention, is believed to be the result of a combination of factors. These factors may include, but are not limited to: 1. The effectiveness of the local anesthetics used. 2. The effects of the analgesic(s) and/or anxiolytic(s) used. 3. The degree of discomfort experienced by the patient at the time of the procedure. 4. The patients ability and reliability in recalling and recording the events. 5. The presence and influence of possible secondary gains and/or psychosocial factors. Reported result: Relief experienced during the 1st hour after the procedure: 100 % (Ultra-Short Term Relief)            Interpretative annotation: Clinically appropriate result. Analgesia during this period is likely to be Local Anesthetic and/or IV Sedative (Analgesic/Anxiolytic) related.          Effects of local anesthetic: The analgesic effects attained during this period are directly associated to the localized infiltration of local anesthetics and therefore cary significant diagnostic value as to the etiological location, or anatomical origin, of the pain. Expected duration of relief is directly dependent on the pharmacodynamics of the local anesthetic used. Long-acting (4-6 hours) anesthetics used.  Reported result: Relief during the next 4 to 6 hour after the procedure: 100 % (Short-Term Relief)            Interpretative annotation: Clinically appropriate result. Analgesia during this period is likely to be Local Anesthetic-related.          Long-term benefit: Defined as the period of time past the expected duration of local anesthetics (1 hour for short-acting and 4-6 hours for long-acting). With the possible exception of prolonged sympathetic blockade from the  local anesthetics, benefits during this period are typically attributed to, or associated with, other factors such as analgesic sensory neuropraxia, antiinflammatory effects, or beneficial biochemical changes provided by agents other than the local anesthetics.  Reported result: Extended relief following procedure: 100 % (Long-Term Relief)            Interpretative annotation: Clinically possible results. Good relief. No permanent benefit expected. Inflammation plays a part in the etiology to the pain.          Current benefits: Defined as reported results that persistent at this point in time.   Analgesia: 100 % Sabrina Castro reports improvement of axial and extremity symptoms. Function: Sabrina Castro reports improvement in function ROM: Sabrina Castro reports improvement in ROM Interpretative annotation: Complete relief. Therapeutic success. Effective therapeutic approach.          Interpretation: Results would suggest a successful diagnostic intervention.                  Plan:  Please see "Plan of Care" for details.                Laboratory Chemistry  Inflammation Markers (CRP: Acute Phase) (ESR: Chronic Phase) Lab Results  Component Value Date   CRP 28.4 (H) 09/13/2017   ESRSEDRATE 50 (H) 09/13/2017                         Rheumatology Markers No results found for: RF, ANA, LABURIC, URICUR, LYMEIGGIGMAB, LYMEABIGMQN, HLAB27                      Renal Markers Lab Results  Component Value Date   BUN 8 09/13/2017   CREATININE 0.90 09/13/2017   BCR 9 09/13/2017   GFRAA  83 09/13/2017   GFRNONAA 72 09/13/2017                             Hepatic Markers Lab Results  Component Value Date   AST 14 09/13/2017   ALBUMIN 4.2 09/13/2017                        Neuropathy Markers Lab Results  Component Value Date   VITAMINB12 >2000 (H) 09/13/2017                        Hematology Parameters Lab Results  Component Value Date   PLT 410 04/28/2017   HGB 11.7 (L) 04/28/2017   HCT 35.9  04/28/2017                        CV Markers Lab Results  Component Value Date   TROPONINI <0.03 04/28/2017                         Note: Lab results reviewed.  Recent Imaging Results   Results for orders placed in visit on 04/06/18  DG C-Arm 1-60 Min-No Report   Narrative Fluoroscopy was utilized by the requesting physician.  No radiographic  interpretation.    Interpretation Report: Fluoroscopy was used during the procedure to assist with needle guidance. The images were interpreted intraoperatively by the requesting physician.  Meds   Current Outpatient Medications:  .  amLODipine (NORVASC) 5 MG tablet, Take 2.5 mg by mouth daily. , Disp: , Rfl:  .  DULoxetine (CYMBALTA) 60 MG capsule, Take 60 mg by mouth daily., Disp: , Rfl:  .  ergocalciferol (VITAMIN D2) 50000 units capsule, Take 50,000 Units by mouth once a week., Disp: , Rfl:  .  ferrous sulfate 325 (65 FE) MG tablet, Take 325 mg by mouth daily with breakfast., Disp: , Rfl:  .  gabapentin (NEURONTIN) 300 MG capsule, Take 1-3 capsules (300-900 mg total) by mouth 4 (four) times daily. Follow the written titration schedule., Disp: 360 capsule, Rfl: 2 .  metoprolol succinate (TOPROL-XL) 50 MG 24 hr tablet, Take 50 mg by mouth daily. Take with or immediately following a meal., Disp: , Rfl:  .  Multiple Vitamin (MULTIVITAMIN) tablet, Take 1 tablet by mouth daily., Disp: , Rfl:  .  olmesartan-hydrochlorothiazide (BENICAR HCT) 20-12.5 MG tablet, Take 1 tablet by mouth daily., Disp: , Rfl:  .  pantoprazole (PROTONIX) 40 MG tablet, Take 40 mg by mouth daily., Disp: , Rfl:  .  traMADol (ULTRAM) 50 MG tablet, Take 1-2 tablets (50-100 mg total) by mouth every 6 (six) hours as needed for severe pain., Disp: 240 tablet, Rfl: 2 .  traZODone (DESYREL) 50 MG tablet, Take 50 mg by mouth at bedtime., Disp: , Rfl:   ROS  Constitutional: Denies any fever or chills Gastrointestinal: No reported hemesis, hematochezia, vomiting, or acute GI  distress Musculoskeletal: Denies any acute onset joint swelling, redness, loss of ROM, or weakness Neurological: No reported episodes of acute onset apraxia, aphasia, dysarthria, agnosia, amnesia, paralysis, loss of coordination, or loss of consciousness  Allergies  Sabrina Castro is allergic to lisinopril.  [place]  Drug: Sabrina Castro  reports that she does not use drugs. Alcohol:  reports that she does not drink alcohol. Tobacco:  reports that she has never smoked. She has never used smokeless tobacco. Medical:  has a past medical history of Depression, GERD (gastroesophageal reflux disease), and Hypertension. Surgical: Sabrina Castro  has a past surgical history that includes Colonoscopy with propofol (N/A, 01/09/2016); Esophagogastroduodenoscopy (egd) with propofol (N/A, 01/09/2016); and Cholecystectomy open. Family: family history includes Breast cancer in her maternal aunt.  Constitutional Exam  General appearance: Well nourished, well developed, and well hydrated. In no apparent acute distress Vitals:   04/26/18 1355  BP: 123/78  Pulse: 82  Temp: 97.9 F (36.6 C)  SpO2: 100%  Weight: (!) 359 lb (162.8 kg)   BMI Assessment: Estimated body mass index is 61.62 kg/m as calculated from the following:   Height as of 04/06/18: '5\' 4"'  (1.626 m).   Weight as of this encounter: 359 lb (162.8 kg).  BMI interpretation table: BMI level Category Range association with higher incidence of chronic pain  <18 kg/m2 Underweight   18.5-24.9 kg/m2 Ideal body weight   25-29.9 kg/m2 Overweight Increased incidence by 20%  30-34.9 kg/m2 Obese (Class I) Increased incidence by 68%  35-39.9 kg/m2 Severe obesity (Class II) Increased incidence by 136%  >40 kg/m2 Extreme obesity (Class III) Increased incidence by 254%   Patient's current BMI Ideal Body weight  Body mass index is 61.62 kg/m. Ideal body weight: 54.7 kg (120 lb 9.5 oz) Adjusted ideal body weight: 98 kg (215 lb 15.3 oz)   BMI Readings from Last 4  Encounters:  04/26/18 61.62 kg/m  04/06/18 61.79 kg/m  03/27/18 60.08 kg/m  03/02/18 57.50 kg/m   Wt Readings from Last 4 Encounters:  04/26/18 (!) 359 lb (162.8 kg)  04/06/18 (!) 360 lb (163.3 kg)  03/27/18 (!) 350 lb (158.8 kg)  03/02/18 (!) 335 lb (152 kg)  Psych/Mental status: Alert, oriented x 3 (person, place, & time)       Eyes: PERLA Respiratory: No evidence of acute respiratory distress  Cervical Spine Area Exam  Skin & Axial Inspection: No masses, redness, edema, swelling, or associated skin lesions Alignment: Symmetrical Functional ROM: Unrestricted ROM      Stability: No instability detected Muscle Tone/Strength: Functionally intact. No obvious neuro-muscular anomalies detected. Sensory (Neurological): Unimpaired Palpation: No palpable anomalies              Upper Extremity (UE) Exam    Side: Right upper extremity  Side: Left upper extremity  Skin & Extremity Inspection: Skin color, temperature, and hair growth are WNL. No peripheral edema or cyanosis. No masses, redness, swelling, asymmetry, or associated skin lesions. No contractures.  Skin & Extremity Inspection: Skin color, temperature, and hair growth are WNL. No peripheral edema or cyanosis. No masses, redness, swelling, asymmetry, or associated skin lesions. No contractures.  Functional ROM: Unrestricted ROM          Functional ROM: Unrestricted ROM          Muscle Tone/Strength: Functionally intact. No obvious neuro-muscular anomalies detected.  Muscle Tone/Strength: Functionally intact. No obvious neuro-muscular anomalies detected.  Sensory (Neurological): Unimpaired          Sensory (Neurological): Unimpaired          Palpation: No palpable anomalies              Palpation: No palpable anomalies              Provocative Test(s):  Phalen's test: deferred Tinel's test: deferred Apley's scratch test (touch opposite shoulder):  Action 1 (Across chest): deferred Action 2 (Overhead): deferred Action 3 (LB  reach): deferred   Provocative Test(s):  Phalen's test: deferred Tinel's  test: deferred Apley's scratch test (touch opposite shoulder):  Action 1 (Across chest): deferred Action 2 (Overhead): deferred Action 3 (LB reach): deferred    Thoracic Spine Area Exam  Skin & Axial Inspection: No masses, redness, or swelling Alignment: Symmetrical Functional ROM: Unrestricted ROM Stability: No instability detected Muscle Tone/Strength: Functionally intact. No obvious neuro-muscular anomalies detected. Sensory (Neurological): Unimpaired Muscle strength & Tone: No palpable anomalies  Lumbar Spine Area Exam  Skin & Axial Inspection: No masses, redness, or swelling Alignment: Symmetrical Functional ROM: Unrestricted ROM       Stability: No instability detected Muscle Tone/Strength: Functionally intact. No obvious neuro-muscular anomalies detected. Sensory (Neurological): Unimpaired Palpation: No palpable anomalies       Provocative Tests: Hyperextension/rotation test: deferred today       Lumbar quadrant test (Kemp's test): deferred today       Lateral bending test: deferred today       Patrick's Maneuver: deferred today                   FABER test: deferred today                   S-I anterior distraction/compression test: deferred today         S-I lateral compression test: deferred today         S-I Thigh-thrust test: deferred today         S-I Gaenslen's test: deferred today          Gait & Posture Assessment  Ambulation: Unassisted Gait: Relatively normal for age and body habitus Posture: WNL   Lower Extremity Exam    Side: Right lower extremity  Side: Left lower extremity  Stability: No instability observed          Stability: No instability observed          Skin & Extremity Inspection: Skin color, temperature, and hair growth are WNL. No peripheral edema or cyanosis. No masses, redness, swelling, asymmetry, or associated skin lesions. No contractures.  Skin & Extremity  Inspection: Skin color, temperature, and hair growth are WNL. No peripheral edema or cyanosis. No masses, redness, swelling, asymmetry, or associated skin lesions. No contractures.  Functional ROM: Unrestricted ROM                  Functional ROM: Unrestricted ROM                  Muscle Tone/Strength: Functionally intact. No obvious neuro-muscular anomalies detected.  Muscle Tone/Strength: Functionally intact. No obvious neuro-muscular anomalies detected.  Sensory (Neurological): Unimpaired        Sensory (Neurological): Unimpaired        DTR: Patellar: deferred today Achilles: deferred today Plantar: deferred today  DTR: Patellar: deferred today Achilles: deferred today Plantar: deferred today  Palpation: No palpable anomalies  Palpation: No palpable anomalies   Assessment  Primary Diagnosis & Pertinent Problem List: The primary encounter diagnosis was Cervicalgia (Right). Diagnoses of Cervical facet syndrome (Right), Cervical radiculitis, DDD (degenerative disc disease), cervical, and Morbid obesity with BMI of 50.0-59.9, adult ([place]) were also pertinent to this visit.  Status Diagnosis  Controlled Controlled Controlled 1. Cervicalgia (Right)   2. Cervical facet syndrome (Right)   3. Cervical radiculitis   4. DDD (degenerative disc disease), cervical   5. Morbid obesity with BMI of 50.0-59.9, adult ([place])     Problems updated and reviewed during this visit: No problems updated. Plan of Care  Pharmacotherapy (Medications Ordered): No orders of  the defined types were placed in this encounter.  Medications administered today: Kinslei Madaris had no medications administered during this visit.  Procedure Orders    No procedure(s) ordered today   Lab Orders  No laboratory test(s) ordered today   Imaging Orders  No imaging studies ordered today    Referral Orders     Amb Ref to Medical Weight Management     Amb Referral to Bariatric Surgery Interventional management  options: Planned, scheduled, and/or pending:   NOTE:NO RFAuntil BMI is <35. None at this time   Considering:   Diagnostic right-sided C7-T1 cervical epidural steroid injection #3 Diagnostic bilateral lumbar facet block#3 Possible bilateral lumbar facet RFA(Starting w/ left)(Needs to bring BMIdown to 30-32 before proceeding.) Diagnostic bilateral sacroiliac joint block Possible bilateral sacroiliac joint RFA Diagnostic L2-3 LESI Diagnostic L3-4 LESI Diagnostic L4-5 LESI Diagnostic right-sided L2-3 transforaminalESI Diagnostic bilateral L4-5 transforaminal ESI Diagnostic bilateral intra-articular hip joint injection Diagnostic bilateral femoral nerve +obturatornerve block Possible bilateral femoral nerve +obturator nerve RFA Diagnostic bilateral intra-articular knee injectionwith local anesthetic and steroid  Possible bilateral, series of 5, intra-articular Hyalgan knee injections Diagnostic bilateral genicular nerve block Possible bilateral genicular nerve RFA   Palliative PRN treatment(s):   Palliative bilateral lumbar facet block Diagnostic right-sided cervical facet block #2 under fluoroscopic guidance and IV sedation   Provider-requested follow-up: Return for PRN Procedure.  Future Appointments  Date Time Provider [place]  05/29/2018  9:45 AM Vevelyn Francois, NP [facility] None   Primary Care Physician: Tracie Harrier, MD Location: [facility] Outpatient Pain Management Facility Note by: Gaspar Cola, MD Date: 04/26/2018; Time: 2:56 PM

## 2018-05-29 ENCOUNTER — Encounter: Payer: BLUE CROSS/BLUE SHIELD | Admitting: Nurse Practitioner

## 2018-06-07 ENCOUNTER — Other Ambulatory Visit: Payer: Self-pay | Admitting: Pain Medicine

## 2018-06-07 DIAGNOSIS — G894 Chronic pain syndrome: Secondary | ICD-10-CM

## 2018-06-07 DIAGNOSIS — M792 Neuralgia and neuritis, unspecified: Secondary | ICD-10-CM

## 2018-06-09 ENCOUNTER — Other Ambulatory Visit: Payer: Self-pay | Admitting: Family Medicine

## 2018-06-09 ENCOUNTER — Ambulatory Visit: Payer: BLUE CROSS/BLUE SHIELD | Attending: Family Medicine

## 2018-06-09 DIAGNOSIS — R600 Localized edema: Secondary | ICD-10-CM

## 2018-06-14 ENCOUNTER — Ambulatory Visit: Payer: BLUE CROSS/BLUE SHIELD | Admitting: Dietician

## 2018-11-22 ENCOUNTER — Other Ambulatory Visit: Payer: Self-pay | Admitting: Family Medicine

## 2018-11-22 DIAGNOSIS — Z1231 Encounter for screening mammogram for malignant neoplasm of breast: Secondary | ICD-10-CM

## 2019-01-03 ENCOUNTER — Ambulatory Visit
Admission: RE | Admit: 2019-01-03 | Discharge: 2019-01-03 | Disposition: A | Discharge status: Home / Self Care | Payer: BLUE CROSS/BLUE SHIELD | Source: Ambulatory Visit | Attending: Family Medicine | Admitting: Family Medicine

## 2019-01-03 DIAGNOSIS — Z1231 Encounter for screening mammogram for malignant neoplasm of breast: Secondary | ICD-10-CM | POA: Insufficient documentation

## 2020-04-04 ENCOUNTER — Other Ambulatory Visit: Payer: Self-pay | Admitting: Physician Assistant

## 2020-04-04 DIAGNOSIS — Z1231 Encounter for screening mammogram for malignant neoplasm of breast: Secondary | ICD-10-CM

## 2020-04-23 IMAGING — MG MM DIGITAL SCREENING BILAT W/ TOMO W/ CAD
8 of 23 series · 8 of 40 positions shown · non-contrast
Comparison: Previous exam(s).

CLINICAL DATA: Screening.

EXAM:
DIGITAL SCREENING BILATERAL MAMMOGRAM WITH TOMO AND CAD

[R CC synth-2D (1 of 2)]
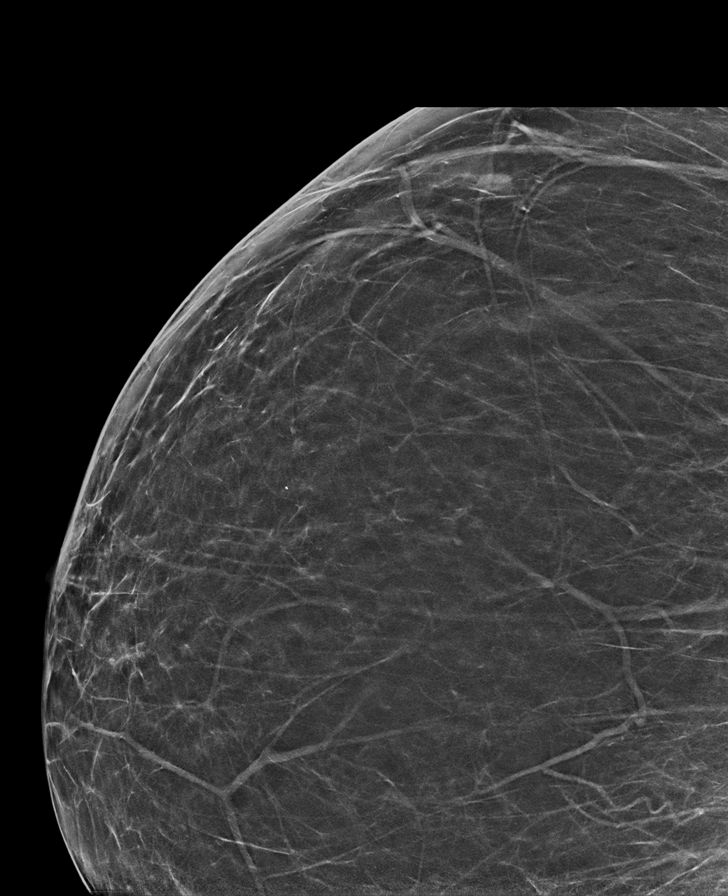

[L CC synth-2D (1 of 3)]
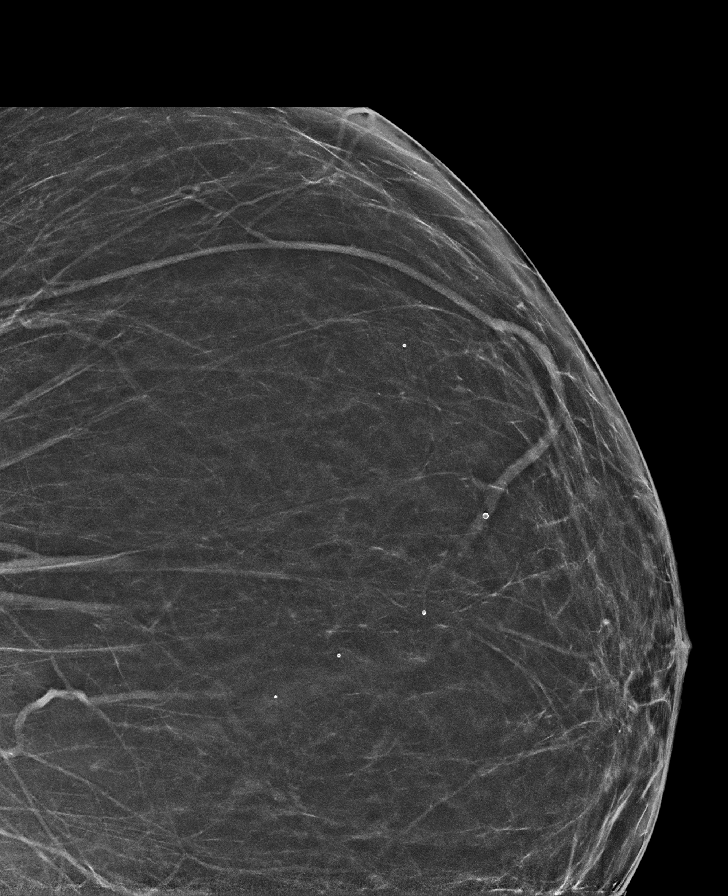

[R CC synth-2D (2 of 2)]
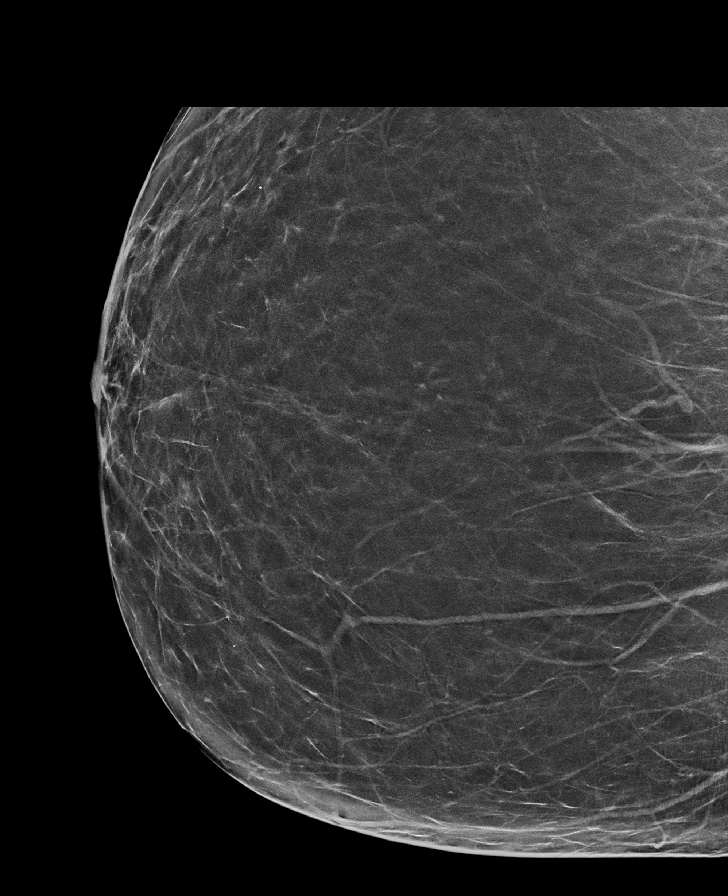

[L CC synth-2D (2 of 3)]
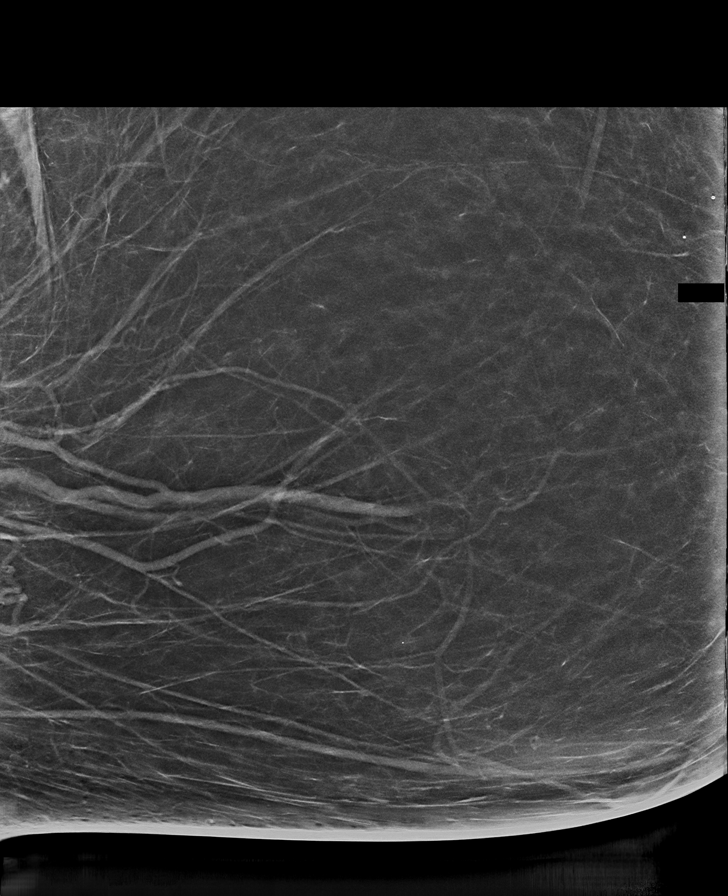

[L CC synth-2D (3 of 3)]
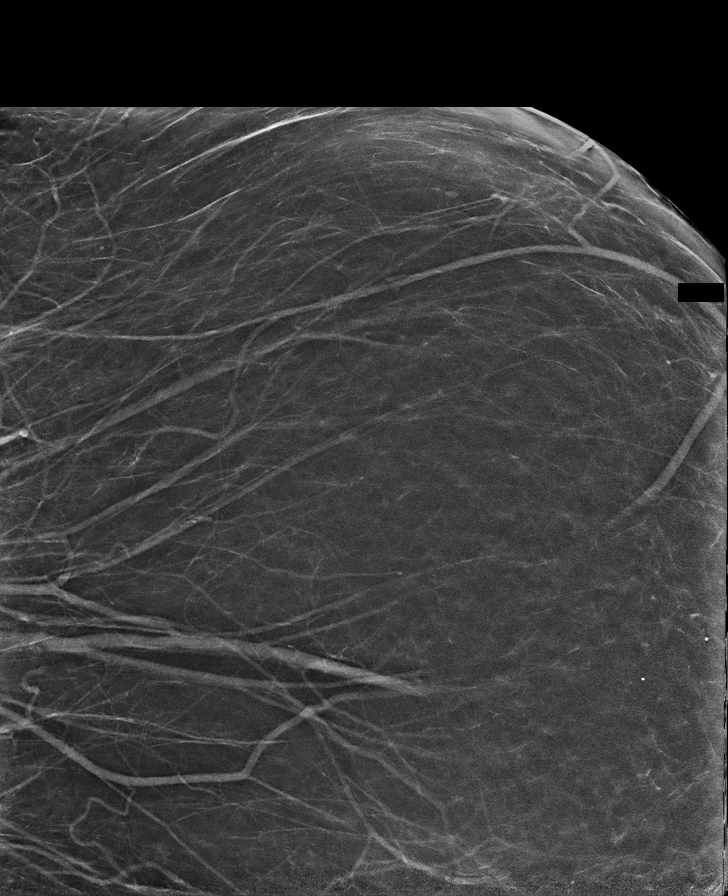

[R MLO synth-2D]
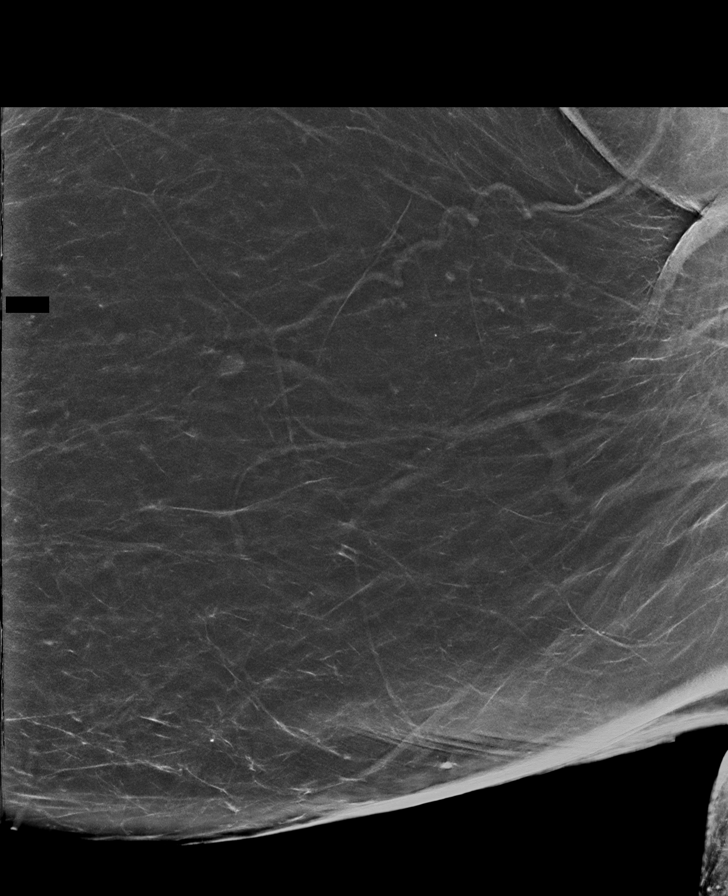

[L MLO synth-2D]
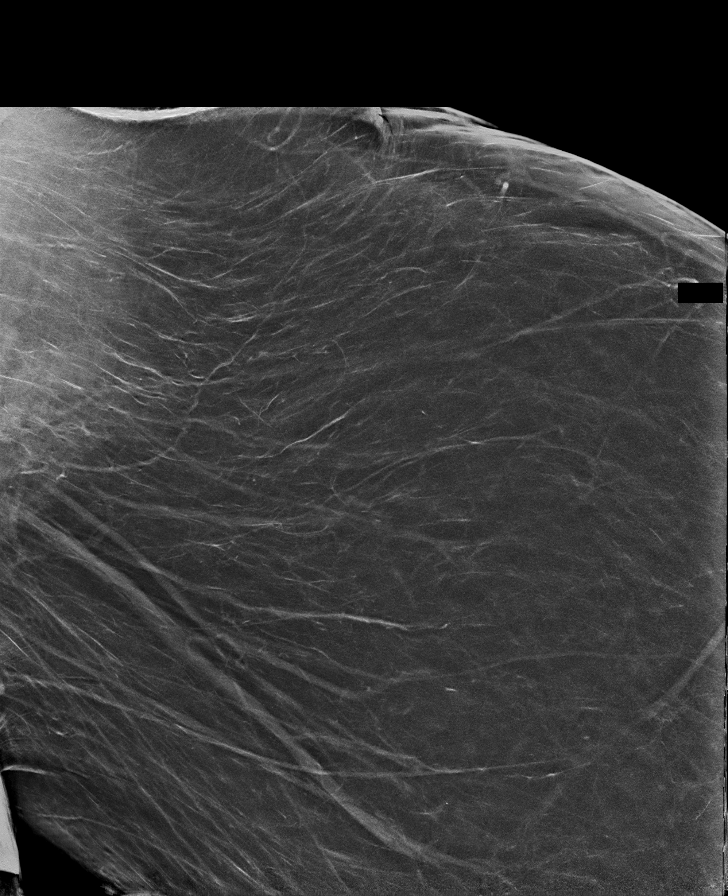

[L MLO tomo · tomo slice 59/87.0]
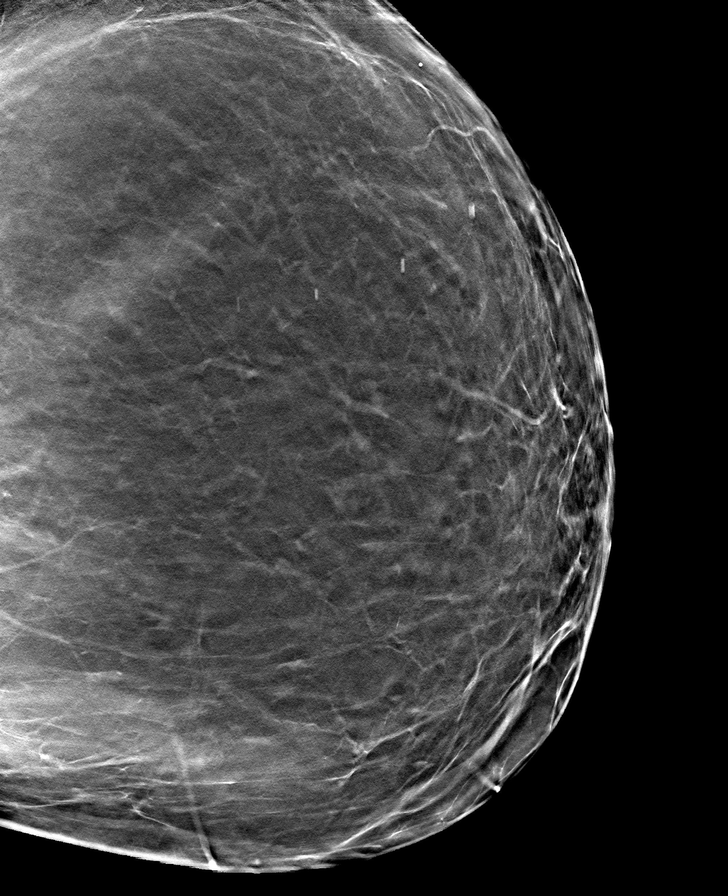

[8 of 40 positions shown; findings below may reference images not displayed]

ACR Breast Density Category b: There are scattered areas of
fibroglandular density.
FINDINGS: There are no findings suspicious for malignancy. Images were
processed with CAD.
IMPRESSION: No mammographic evidence of malignancy. A result letter of this
screening mammogram will be mailed directly to the patient.

RECOMMENDATION:
Screening mammogram in one year. (Code:[id number])

BI-RADS CATEGORY  1: Negative.

## 2021-01-20 ENCOUNTER — Other Ambulatory Visit: Payer: Self-pay | Admitting: Physician Assistant

## 2021-01-20 DIAGNOSIS — Z1231 Encounter for screening mammogram for malignant neoplasm of breast: Secondary | ICD-10-CM

## 2023-01-17 ENCOUNTER — Other Ambulatory Visit: Payer: Self-pay | Admitting: Internal Medicine

## 2023-01-17 DIAGNOSIS — Z1231 Encounter for screening mammogram for malignant neoplasm of breast: Secondary | ICD-10-CM

## 2023-02-17 ENCOUNTER — Ambulatory Visit
Admission: RE | Admit: 2023-02-17 | Discharge: 2023-02-17 | Disposition: A | Discharge status: Home / Self Care | Payer: Medicare PPO | Source: Ambulatory Visit | Attending: Internal Medicine | Admitting: Internal Medicine

## 2023-02-17 DIAGNOSIS — Z1231 Encounter for screening mammogram for malignant neoplasm of breast: Secondary | ICD-10-CM | POA: Insufficient documentation

## 2023-02-21 ENCOUNTER — Inpatient Hospital Stay
Admission: RE | Admit: 2023-02-21 | Discharge: 2023-02-21 | Disposition: A | Discharge status: Home / Self Care | Payer: Self-pay | Source: Ambulatory Visit | Attending: Internal Medicine | Admitting: Internal Medicine

## 2023-02-21 ENCOUNTER — Other Ambulatory Visit: Payer: Self-pay | Admitting: *Deleted

## 2023-02-21 DIAGNOSIS — Z1231 Encounter for screening mammogram for malignant neoplasm of breast: Secondary | ICD-10-CM

## 2023-05-23 ENCOUNTER — Ambulatory Visit: Payer: Medicare PPO | Admitting: Urology

## 2023-05-23 ENCOUNTER — Encounter: Payer: Self-pay | Admitting: Urology

## 2023-05-23 VITALS — BP 135/85 | HR 97

## 2023-05-23 DIAGNOSIS — N3941 Urge incontinence: Secondary | ICD-10-CM

## 2023-05-23 DIAGNOSIS — R32 Unspecified urinary incontinence: Secondary | ICD-10-CM

## 2023-05-23 DIAGNOSIS — N3281 Overactive bladder: Secondary | ICD-10-CM | POA: Diagnosis not present

## 2023-05-23 NOTE — Progress Notes (Signed)
05/23/2023 12:59 PM   Sabrina Castro [birth year] [id number]  Referring provider: Louis Matte, MD [address] [place],  [place] [address]  No chief complaint on file.   HPI: I was consulted to assess the patient's urinary incontinence.  She primarily has urge incontinence.  She has high-volume bedwetting soaking double pads.  She soaks 6 pads a day.  No stress incontinence.  She voids every hour to 2 hours.  She does not get up at night because of high-volume bedwetting.  Myrbetriq currently not helping.  Is failed oxybutynin by history  No hysterectomy  No neurologic issues.  No history of kidney stones bladder surgery or bladder infections   PMH: Past Medical History:  Diagnosis Date   Depression    GERD (gastroesophageal reflux disease)    Hypertension     Surgical History: Past Surgical History:  Procedure Laterality Date   CHOLECYSTECTOMY OPEN     COLONOSCOPY WITH PROPOFOL N/A 01/09/2016   Procedure: COLONOSCOPY WITH PROPOFOL;  Surgeon: Christena Deem, MD;  Location: [facility] ENDOSCOPY;  Service: Endoscopy;  Laterality: N/A;   ESOPHAGOGASTRODUODENOSCOPY (EGD) WITH PROPOFOL N/A 01/09/2016   Procedure: ESOPHAGOGASTRODUODENOSCOPY (EGD) WITH PROPOFOL;  Surgeon: Christena Deem, MD;  Location: [facility] ENDOSCOPY;  Service: Endoscopy;  Laterality: N/A;    Home Medications:  Allergies as of 05/23/2023       Reactions   Lisinopril Hives   Cough - pt told to stop taking 04/2017        Medication List        Accurate as of May 23, 2023 12:59 PM. If you have any questions, ask your nurse or doctor.          amLODipine 5 MG tablet Commonly known as: NORVASC Take 2.5 mg by mouth daily.   DULoxetine 60 MG capsule Commonly known as: CYMBALTA Take 60 mg by mouth daily.   ergocalciferol 1.25 MG (50000 UT) capsule Commonly known as: VITAMIN D2 Take 50,000 Units by mouth once a week.   ferrous sulfate 325 (65 FE) MG tablet Take 325 mg by mouth daily  with breakfast.   gabapentin 300 MG capsule Commonly known as: Neurontin Take 1-3 capsules (300-900 mg total) by mouth 4 (four) times daily. Follow the written titration schedule.   metoprolol succinate 50 MG 24 hr tablet Commonly known as: TOPROL-XL Take 50 mg by mouth daily. Take with or immediately following a meal.   multivitamin tablet Take 1 tablet by mouth daily.   olmesartan-hydrochlorothiazide 20-12.5 MG tablet Commonly known as: BENICAR HCT Take 1 tablet by mouth daily.   pantoprazole 40 MG tablet Commonly known as: PROTONIX Take 40 mg by mouth daily.   traMADol 50 MG tablet Commonly known as: ULTRAM Take 1-2 tablets (50-100 mg total) by mouth every 6 (six) hours as needed for severe pain.   traZODone 50 MG tablet Commonly known as: DESYREL Take 50 mg by mouth at bedtime.        Allergies:  Allergies  Allergen Reactions   Lisinopril Hives    Cough - pt told to stop taking 04/2017    Family History: Family History  Problem Relation Age of Onset   Breast cancer Maternal Aunt     Social History:  reports that she has never smoked. She has never used smokeless tobacco. She reports that she does not drink alcohol and does not use drugs.  ROS:  Physical Exam: LMP  (LMP Unknown)   Constitutional:  Alert and oriented, No acute distress. HEENT: [place] AT, moist mucus membranes.  Trachea midline, no masses. Cardiovascular: No clubbing, cyanosis, or edema. Respiratory: Normal respiratory effort, no increased work of breathing. GI: Abdomen is soft, nontender, nondistended, no abdominal masses GU: On pelvic examination patient had no stress incontinence and a reasonably well supported bladder neck.  Little to no cystocele. Skin: No rashes, bruises or suspicious lesions. Lymph: No cervical or inguinal adenopathy. Neurologic: Grossly intact, no focal deficits, moving all 4 extremities. Psychiatric:  Normal mood and affect.  Laboratory Data: Lab Results  Component Value Date   WBC 13.2 (H) 04/28/2017   HGB 11.7 (L) 04/28/2017   HCT 35.9 04/28/2017   MCV 80.1 04/28/2017   PLT 410 04/28/2017    Lab Results  Component Value Date   CREATININE 0.90 09/13/2017    No results found for: "PSA"  No results found for: "TESTOSTERONE"  No results found for: "HGBA1C"  Urinalysis    Component Value Date/Time   COLORURINE STRAW (A) 04/28/2017 1127   APPEARANCEUR CLEAR (A) 04/28/2017 1127   LABSPEC 1.008 04/28/2017 1127   PHURINE 7.0 04/28/2017 1127   GLUCOSEU NEGATIVE 04/28/2017 1127   HGBUR NEGATIVE 04/28/2017 1127   BILIRUBINUR NEGATIVE 04/28/2017 1127   KETONESUR NEGATIVE 04/28/2017 1127   PROTEINUR NEGATIVE 04/28/2017 1127   NITRITE NEGATIVE 04/28/2017 1127   LEUKOCYTESUR NEGATIVE 04/28/2017 1127    Pertinent Imaging: Urine reviewed and sent for culture.  Chart reviewed  Assessment & Plan: Patient has refractory overactive bladder high-volume.  She has high-volume bedwetting.  I ordered urodynamics and she will come back for cystoscopy as well.  There are no diagnoses linked to this encounter.  No follow-ups on file.  [name], MD  [facility] [address] [place], [place] [address] [phone]

## 2023-05-23 NOTE — Patient Instructions (Signed)

## 2023-07-11 ENCOUNTER — Ambulatory Visit: Payer: Medicare HMO | Admitting: Urology

## 2023-07-11 DIAGNOSIS — R32 Unspecified urinary incontinence: Secondary | ICD-10-CM

## 2023-07-11 MED ORDER — GEMTESA 75 MG PO TABS: 75.0000 mg | ORAL_TABLET | Freq: Every day | ORAL | Status: DC

## 2023-07-11 NOTE — Progress Notes (Signed)
07/11/2023 2:14 PM   Sabrina Castro September 15, 1960 [id number]  Referring provider: Barbette Reichmann, MD [address] [facility] [place],  [place] [address]  Chief Complaint  Patient presents with   Cysto    HPI: I was consulted to assess the patient's urinary incontinence.  She primarily has urge incontinence.  She has high-volume bedwetting soaking double pads.  She soaks 6 pads a day.  No stress incontinence.   She voids every hour to 2 hours.  She does not get up at night because of high-volume bedwetting.  Myrbetriq currently not helping.  Is failed oxybutynin by history   No hysterectomy  Patient has refractory overactive bladder high-volume. She has high-volume bedwetting. I ordered urodynamics and she will come back for cystoscopy as well.   Today Frequency stable.  Incontinence stable. During urodynamics patient voided 110 mL with a maximal flow was 25 mL/s.  Residual was a few milliliters.  Maximum bladder capacity was 202 mL.  She had increased bladder sensation.  Her bladder was overactive reaching pressure of 59 cm of water.  The number of overactive bladder contractions were impressive and repetitive.  She had urgency and leaked a moderate amount.  No stress incontinence with Valsalva pressure 103 cm water.  During voiding she voided 145 mL.  Maximal flow 26 mL/s.  Max voiding pressure approximately 40 cm of water.  Residual was 29 mL.  EMG activity normal.  Bladder neck distended at 1 cm the details of the urodynamics are signed dictated     On pelvic examination no stress incontinence or prolapse Cystoscopy: Patient underwent flexible cystoscopy.  Bladder close and trigone were normal.  No cystitis.  No carcinoma.  Procedure well-tolerated   PMH: Past Medical History:  Diagnosis Date   Depression    GERD (gastroesophageal reflux disease)    Hypertension     Surgical History: Past Surgical History:  Procedure Laterality Date   CHOLECYSTECTOMY  OPEN     COLONOSCOPY WITH PROPOFOL N/A 01/09/2016   Procedure: COLONOSCOPY WITH PROPOFOL;  Surgeon: Christena Deem, MD;  Location: [facility] ENDOSCOPY;  Service: Endoscopy;  Laterality: N/A;   ESOPHAGOGASTRODUODENOSCOPY (EGD) WITH PROPOFOL N/A 01/09/2016   Procedure: ESOPHAGOGASTRODUODENOSCOPY (EGD) WITH PROPOFOL;  Surgeon: Christena Deem, MD;  Location: [facility] ENDOSCOPY;  Service: Endoscopy;  Laterality: N/A;    Home Medications:  Allergies as of 07/11/2023       Reactions   Lisinopril Hives   Cough - pt told to stop taking 04/2017        Medication List        Accurate as of July 11, 2023  2:14 PM. If you have any questions, ask your nurse or doctor.          STOP taking these medications    ferrous sulfate 325 (65 FE) MG tablet   metFORMIN 500 MG tablet Commonly known as: GLUCOPHAGE   oxybutynin 10 MG 24 hr tablet Commonly known as: DITROPAN-XL   oxybutynin 5 MG 24 hr tablet Commonly known as: DITROPAN-XL       TAKE these medications    baclofen 10 MG tablet Commonly known as: LIORESAL Take 10 mg by mouth 2 (two) times daily as needed.   DULoxetine 60 MG capsule Commonly known as: CYMBALTA Take 60 mg by mouth daily.   gabapentin 300 MG capsule Commonly known as: Neurontin Take 1-3 capsules (300-900 mg total) by mouth 4 (four) times daily. Follow the written titration schedule.   metoprolol succinate 50 MG  24 hr tablet Commonly known as: TOPROL-XL Take 50 mg by mouth daily. Take with or immediately following a meal.   multivitamin tablet Take 1 tablet by mouth daily.   Myrbetriq 25 MG Tb24 tablet Generic drug: mirabegron ER Take 25 mg by mouth daily.   pantoprazole 40 MG tablet Commonly known as: PROTONIX Take 40 mg by mouth daily.   topiramate 50 MG tablet Commonly known as: TOPAMAX Take one tablet in the morning an two tablets at night.        Allergies:  Allergies  Allergen Reactions   Lisinopril Hives    Cough - pt told to stop  taking 04/2017    Family History: Family History  Problem Relation Age of Onset   Breast cancer Maternal Aunt     Social History:  reports that she has never smoked. She has never used smokeless tobacco. She reports that she does not drink alcohol and does not use drugs.  ROS:                                        Physical Exam: LMP  (LMP Unknown)   Constitutional:  Alert and oriented, No acute distress. HEENT: [place] AT, moist mucus membranes.  Trachea midline, no masses.   Laboratory Data: Lab Results  Component Value Date   WBC 13.2 (H) 04/28/2017   HGB 11.7 (L) 04/28/2017   HCT 35.9 04/28/2017   MCV 80.1 04/28/2017   PLT 410 04/28/2017    Lab Results  Component Value Date   CREATININE 0.90 09/13/2017    No results found for: "PSA"  No results found for: "TESTOSTERONE"  No results found for: "HGBA1C"  Urinalysis    Component Value Date/Time   COLORURINE STRAW (A) 04/28/2017 1127   APPEARANCEUR CLEAR (A) 04/28/2017 1127   LABSPEC 1.008 04/28/2017 1127   PHURINE 7.0 04/28/2017 1127   GLUCOSEU NEGATIVE 04/28/2017 1127   HGBUR NEGATIVE 04/28/2017 1127   BILIRUBINUR NEGATIVE 04/28/2017 1127   KETONESUR NEGATIVE 04/28/2017 1127   PROTEINUR NEGATIVE 04/28/2017 1127   NITRITE NEGATIVE 04/28/2017 1127   LEUKOCYTESUR NEGATIVE 04/28/2017 1127    Pertinent Imaging:   Assessment & Plan: Patient has refractory overactive bladder.  Reassess on [place].  She would then be a good candidate for one of the 3 refractory treatments.  Call if culture positive I mentioned without detail the 3 refractory treatments.  Hopefully the Gemtesa helps  1. Urinary incontinence, unspecified type (Primary)  - Urinalysis, Complete   No follow-ups on file.  [name], MD  [facility] [address] [place], [place] [address] [phone]

## 2023-07-12 LAB — URINALYSIS, COMPLETE
Bilirubin, UA: NEGATIVE
Glucose, UA: NEGATIVE
Ketones, UA: NEGATIVE
Leukocytes,UA: NEGATIVE
Nitrite, UA: NEGATIVE
Protein,UA: NEGATIVE
RBC, UA: NEGATIVE
Specific Gravity, UA: 1.02 (ref 1.005–1.030)
Urobilinogen, Ur: 0.2 mg/dL (ref 0.2–1.0)
pH, UA: 7 (ref 5.0–7.5)

## 2023-07-12 LAB — MICROSCOPIC EXAMINATION

## 2023-07-14 LAB — CULTURE, URINE COMPREHENSIVE

## 2023-09-05 ENCOUNTER — Ambulatory Visit: Admitting: Urology

## 2023-09-05 ENCOUNTER — Ambulatory Visit: Payer: Medicare HMO | Admitting: Urology

## 2023-09-05 ENCOUNTER — Encounter: Payer: Self-pay | Admitting: Urology

## 2023-09-05 VITALS — BP 144/84 | HR 79 | Ht 64.0 in | Wt 311.0 lb

## 2023-09-05 DIAGNOSIS — R32 Unspecified urinary incontinence: Secondary | ICD-10-CM

## 2023-09-05 DIAGNOSIS — N3941 Urge incontinence: Secondary | ICD-10-CM | POA: Diagnosis not present

## 2023-09-05 DIAGNOSIS — N3281 Overactive bladder: Secondary | ICD-10-CM | POA: Diagnosis not present

## 2023-09-05 MED ORDER — SOLIFENACIN SUCCINATE 5 MG PO TABS
5.0000 mg | ORAL_TABLET | Freq: Every day | ORAL | 11 refills | Status: AC
Start: 2023-09-05 — End: ?

## 2023-09-05 NOTE — Progress Notes (Signed)
 09/05/2023 2:53 PM   Sabrina Castro 1960-12-21 [id number]  Referring provider: Louis Matte, MD [address] [place],  [place] [address]  No chief complaint on file.   HPI: I was consulted to assess the patient's urinary incontinence.  She primarily has urge incontinence.  She has high-volume bedwetting soaking double pads.  She soaks 6 pads a day.  No stress incontinence.   She voids every hour to 2 hours.  She does not get up at night because of high-volume bedwetting.  Myrbetriq currently not helping.  Is failed oxybutynin by history   No hysterectomy   Patient has refractory overactive bladder high-volume. She has high-volume bedwetting. I ordered urodynamics and she will come back for cystoscopy as well.    Today Frequency stable.  Incontinence stable. During urodynamics patient voided 110 mL with a maximal flow was 25 mL/s.  Residual was a few milliliters.  Maximum bladder capacity was 202 mL.  She had increased bladder sensation.  Her bladder was overactive reaching pressure of 59 cm of water.  The number of overactive bladder contractions were impressive and repetitive.  She had urgency and leaked a moderate amount.  No stress incontinence with Valsalva pressure 103 cm water.  During voiding she voided 145 mL.  Maximal flow 26 mL/s.  Max voiding pressure approximately 40 cm of water.  Residual was 29 mL.  EMG activity normal.  Bladder neck distended at 1 cm the details of the urodynamics are signed dictated     On pelvic examination no stress incontinence or prolapse Cystoscopy: Patient underwent flexible cystoscopy.  Bladder close and trigone were normal.  No cystitis.  No carcinoma.  Procedure well-tolerated    Patient has refractory overactive bladder.  Reassess on [place].  She would then be a good candidate for one of the 3 refractory treatments.  Call if culture positive I mentioned without detail the 3 refractory treatments.  Hopefully the Gemtesa  helps  Today Frequency stable.  Last culture negative Patient still having urge incontinence and she is afraid to leave the house.  Clinically not infected Gemtesa failed No blood thinner pacemaker.  She can lay on her stomach.  She does get lower injections in her back. I examine her and she is obese but I believe a 5 inch foramen needle would suffice.  This was explained to patient       PMH: Past Medical History:  Diagnosis Date   Depression    GERD (gastroesophageal reflux disease)    Hypertension     Surgical History: Past Surgical History:  Procedure Laterality Date   CHOLECYSTECTOMY OPEN     COLONOSCOPY WITH PROPOFOL N/A 01/09/2016   Procedure: COLONOSCOPY WITH PROPOFOL;  Surgeon: Christena Deem, MD;  Location: [facility] ENDOSCOPY;  Service: Endoscopy;  Laterality: N/A;   ESOPHAGOGASTRODUODENOSCOPY (EGD) WITH PROPOFOL N/A 01/09/2016   Procedure: ESOPHAGOGASTRODUODENOSCOPY (EGD) WITH PROPOFOL;  Surgeon: Christena Deem, MD;  Location: [facility] ENDOSCOPY;  Service: Endoscopy;  Laterality: N/A;    Home Medications:  Allergies as of 09/05/2023       Reactions   Lisinopril Hives   Cough - pt told to stop taking 04/2017        Medication List        Accurate as of September 05, 2023  2:53 PM. If you have any questions, ask your nurse or doctor.          baclofen 10 MG tablet Commonly known as: LIORESAL Take 10 mg by mouth 2 (two)  times daily as needed.   DULoxetine 60 MG capsule Commonly known as: CYMBALTA Take 60 mg by mouth daily.   gabapentin 300 MG capsule Commonly known as: Neurontin Take 1-3 capsules (300-900 mg total) by mouth 4 (four) times daily. Follow the written titration schedule.   Gemtesa 75 MG Tabs Generic drug: Vibegron Take 1 tablet (75 mg total) by mouth daily.   metoprolol succinate 50 MG 24 hr tablet Commonly known as: TOPROL-XL Take 50 mg by mouth daily. Take with or immediately following a meal.   multivitamin tablet Take 1 tablet  by mouth daily.   Myrbetriq 25 MG Tb24 tablet Generic drug: mirabegron ER Take 25 mg by mouth daily.   pantoprazole 40 MG tablet Commonly known as: PROTONIX Take 40 mg by mouth daily.   topiramate 50 MG tablet Commonly known as: TOPAMAX Take one tablet in the morning an two tablets at night.        Allergies:  Allergies  Allergen Reactions   Lisinopril Hives    Cough - pt told to stop taking 04/2017    Family History: Family History  Problem Relation Age of Onset   Breast cancer Maternal Aunt     Social History:  reports that she has never smoked. She has never used smokeless tobacco. She reports that she does not drink alcohol and does not use drugs.  ROS:                                        Physical Exam: LMP  (LMP Unknown)   Constitutional:  Alert and oriented, No acute distress.   Laboratory Data: Lab Results  Component Value Date   WBC 13.2 (H) 04/28/2017   HGB 11.7 (L) 04/28/2017   HCT 35.9 04/28/2017   MCV 80.1 04/28/2017   PLT 410 04/28/2017    Lab Results  Component Value Date   CREATININE 0.90 09/13/2017    No results found for: "PSA"  No results found for: "TESTOSTERONE"  No results found for: "HGBA1C"  Urinalysis    Component Value Date/Time   COLORURINE STRAW (A) 04/28/2017 1127   APPEARANCEUR Clear 07/11/2023 1412   LABSPEC 1.008 04/28/2017 1127   PHURINE 7.0 04/28/2017 1127   GLUCOSEU Negative 07/11/2023 1412   HGBUR NEGATIVE 04/28/2017 1127   BILIRUBINUR Negative 07/11/2023 1412   KETONESUR NEGATIVE 04/28/2017 1127   PROTEINUR Negative 07/11/2023 1412   PROTEINUR NEGATIVE 04/28/2017 1127   NITRITE Negative 07/11/2023 1412   NITRITE NEGATIVE 04/28/2017 1127   LEUKOCYTESUR Negative 07/11/2023 1412    Pertinent Imaging:   Assessment & Plan: I discussed 3 refractory treatments for overactive bladder.  She has mild ankle edema.  Body habitus discussed above.  She would like to proceed with  peripheral nerve evaluation.  She is that she can lay on her stomach.  Site of service of peripheral nerve evaluation and implant discussed.  We discussed stopping procedure if needed.  She said when they do facet joint injections they have mentioned this before to her.  Handouts given.  Full templates discussed  There are no diagnoses linked to this encounter.  No follow-ups on file.  [name], MD  [facility] [address] [place], [place] [address] [phone]

## 2023-09-05 NOTE — Patient Instructions (Signed)
 [employer] InterStim Placement  Past treatments haven't given you satisfactory results, which may be due to the way these treatments focus on the muscle rather than the nerves. They don't target the miscommunication between your bladder and your brain. Because bladder function involves both muscles and nerves, you may need something that addresses the communication problem between the bladder and the brain that can cause symptoms.  Med[employer] Bladder Control Therapy is unique because it offers a simple evaluation to see if it's right for you. The evaluation is a minimally invasive procedure We will be looking to see if your troublesome bladder symptoms are reduced by at least 50% In as few as 3 days, you'll know if:you're a candidate for long-term treatment we simply need more information   It's important that you document your symptoms before and during your evaluation to help me understand if you see any improvements. I will provide you with a Symptom Tracker and I would like you to document your symptoms for a minimum of three days. Bring your Symptom Tracker back with you at your next appointment.  Tracking daily will help [place] determine if the therapy delivered by the InterStimT system is right for you. You will have 3 appointments scheduled: 1st appointment for a 20-minute, in-office procedure that allows you to try the therapy for a few days. 2nd appointment to remove the lead (thin wire) from your evaluation. 3rd appointment is the date for long-term therapy if you're a candidate or for an advanced evaluation if we need more information.  Here's a brochure explaining the in-office procedure and long-term therapy, as well as the Symptom Tracker we discussed to document your symptoms. For more information, visit [email or web address].     Urgent PC office-based treatment for overactive bladder Take back control of your life! Urgent PC is a non-drug, non-surgical option for overactive  bladder and associated symptoms of urinary urgency, urinary frequency and urge incontinence  Urgent-PC- Since 2003, healthcare professionals have used the Urgent PC Neuromodulation System as an effective office treatment for men and women suffering from overactive bladder, a condition commonly referred to as OAB. Urgent PC is up to 80% effective, even after conservative measures and OAB drugs have failed. Plus, Urgent PC is very low risk, making it a great choice for people unable or unwilling to have more invasive procedures.  How does Urgent PC work?  The Urgent PC system delivers a specific type of neuromodulation called percutaneous tibial nerve stimulation (PTNS). During treatment, a small, slim needle electrode is inserted near your ankle. The needle electrode is then connected to the battery-powered stimulator. During your 30-minute treatment, mild impulses from the stimulator travel through the needle electrode, along your leg and to the nerves in your pelvis that control bladder function. This process is also referred to as neuromodulation.  What will I feel with Urgent PC therapy?  Because patients may experience the sensation of the Urgent PC therapy in different ways, it's difficult to say what the treatment would feel like to you. Patients often describe the sensation as "tingling" or "pulsating." Treatment is typically well-tolerated by patients. Urgent PC offers many different levels of stimulation, so your clinician will be able to adjust treatment to suit you as well as address any discomfort that you might experience during treatment.  How often will I need Urgent PC treatments? You will receive an initial series of 12 treatments scheduled about a week apart. If you respond, you will likely need a treatment about once per month  to maintain your improvements.  How soon will I see results with Urgent PC? Because Urgent PC gently modifies the signals to achieve bladder control, it  usually takes 5-7 weeks for symptoms to change. However, patients respond at different rates. In a review of about 100 patients who had success with Urgent PC, symptoms improved anywhere between 2-12 weeks. For about 20% of these patients, the symptoms of urgency and/or urge incontinence didn't improve until after 8 weeks.1  There is no way to anticipate who will respond earlier, later or not at all. That's why it is important to receive the 12 recommended treatments before you and your physician evaluate whether this therapy is an appropriate and effective choice for you.  How can I receive treatment with Urgent PC? Urgent PC is an option for patients with OAB. If you think you have OAB, talk to your doctor, a urologist or urogynecologist. If you have OAB, the doctor will work with you to determine your own personal treatment plan which usually starts with behavior and diet modifications plus medications. Urgent PC is an excellent option if these options don't work or provide sufficient improvements. Treatment with Urgent PC is typically performed at the office of a urologist, urogynecologist or gynecologist. So, if you run out of options with your normal doctor, consider visiting one of these specialists.  Does insurance cover Urgent PC? Urgent PC treatment is reimbursed by Medicare across the [place] . Private insurance coverage varies by state. To see if your insurance company covers Urgent PC, use our Coverage Finder or talk to your Healthcare Provider.  Are there patients who should not be treated with Urgent PC? Yes, these include: patients with pacemakers or implantable defibrillators, patients prone to excessive bleeding, patients with nerve damage that could impact either percutaneous tibial nerve or pelvic floor function and patients who are pregnant or planning to become pregnant during the duration of the treatment.  What are the risks associated with Urgent PC? The risks associated  with Urgent PC therapy are low. Most common side-effects are temporary and include mild pain or skin inflammation at or near the stimulation site. Botulinum Toxin Bladder Injection  A botulinum toxin bladder injection is a procedure to treat an overactive bladder. During the procedure, a drug called botulinum toxin is injected into the bladder through a long, thin needle. This drug relaxes the bladder muscles and reduces overactivity. You may need this procedure if your medicines are not working or you cannot take them. The procedure may be repeated as needed. The treatment is done once and it usually lasts for 6 months. Your health care provider will monitor you to see how well you respond. Tell a health care provider about: Any allergies you have. All medicines you are taking, including vitamins, herbs, eye drops, creams, and over-the-counter medicines. Any problems you or family members have had with anesthetic medicines. Any bleeding problems you have. Any surgeries you have had. Any medical conditions you have. Any previous reactions to a botulinum toxin injection. Any symptoms of urinary tract infection. These include chills, fever, a burning feeling when passing urine, and needing to pass urine often. Whether you are pregnant or may be pregnant. What are the risks? Generally this is a safe procedure. However, problems may occur, including: Not being able to pass urine. If this happens, you may need to have your bladder emptied with a thin tube (urinary catheter). Bleeding. Urinary tract infection. Allergic reaction to the botulinum toxin. Pain or burning when passing  urine. Damage to nearby structures or organs. What happens before the procedure? When to stop eating and drinking Follow instructions from your health care provider about what you may eat and drink before your procedure. These may include: 8 hours before the procedure Stop eating most foods. Do not eat meat, fried foods,  or fatty foods. Eat only light foods, such as toast or crackers. All liquids are okay except energy drinks and alcohol. 6 hours before the procedure Stop eating. Drink only clear liquids, such as water, clear fruit juice, black coffee, plain tea, and sports drinks. Do not drink energy drinks or alcohol. 2 hours before the procedure Stop drinking all liquids. You may be allowed to take medicines with small sips of water. If you do not follow your health care provider's instructions, your procedure may be delayed or canceled. Medicines Ask your health care provider about: Changing or stopping your regular medicines. This is especially important if you are taking diabetes medicines or blood thinners. Taking medicines such as aspirin and ibuprofen. These medicines can thin your blood. Do not take these medicines unless your health care provider tells you to take them. Taking over-the-counter medicines, vitamins, herbs, and supplements. General instructions Ask your health care provider what steps will be taken to help prevent infection. These steps may include: Removing hair at the procedure site. Washing skin with a germ-killing soap. Taking antibiotic medicine. If you will be going home right after the procedure, plan to have a responsible adult: Take you home from the hospital or clinic. You will not be allowed to drive. Care for you for the time you are told. What happens during the procedure?  You will be asked to empty your bladder. An IV will be inserted into one of your veins. You will be given one or more of the following: A medicine to help you relax (sedative). A medicine to numb the area (local anesthetic). A medicine to make you fall asleep (general anesthetic). A long, thin scope called a cystoscope will be passed into your bladder through the part of the body that carries urine from your bladder (urethra). The cystoscope will be used to fill your bladder with water. A  long needle will be passed through the cystoscope and into the bladder. The botulinum toxin will be injected into your bladder. It may be injected into multiple areas of your bladder. The cystoscope will be removed and your bladder will be emptied with a urinary catheter. The procedure may vary among health care providers and hospitals. What can I expect after the procedure? After your procedure, it is common to have: Blood-tinged urine. Burning or soreness when you pass urine. Follow these instructions at home: Medicines Take over-the-counter and prescription medicines only as told by your health care provider. If you were prescribed an antibiotic medicine, take it as told by your health care provider. Do not stop using the antibiotic even if you start to feel better. General instructions  If you were given a sedative during the procedure, it can affect you for several hours. Do not drive or operate machinery until your health care provider says that it is safe. Drink enough fluid to keep your urine pale yellow. Return to your normal activities as told by your health care provider. Ask your health care provider what activities are safe for you. Keep all follow-up visits. Contact a health care provider if you have: A fever or chills. Blood-tinged urine for more than one day after your procedure. Worsening pain  or burning when you pass urine. Pain or burning when passing urine for more than two days after your procedure. Trouble emptying your bladder. Get help right away if you: Have bright red blood in your urine. Are unable to pass urine. Summary A botulinum toxin bladder injection is a procedure to treat an overactive bladder. This is generally a safe procedure. However, problems may occur, including not being able to pass urine, bleeding, infection, pain, and an allergic reaction to the botulinum toxin. You will be told when to stop eating and drinking, and what medicines to change  or stop. Follow instructions carefully. After the procedure, it is common to have blood in your urine and to have soreness or burning when passing urine. Contact a health care provider if you have a fever, blood in your urine for more than a few days, or trouble passing urine. Get help right away if you have bright red blood in your urine, or if you are unable to pass urine. This information is not intended to replace advice given to you by your health care provider. Make sure you discuss any questions you have with your health care provider. Document Revised: 11/14/2020 Document Reviewed: 11/14/2020 Elsevier Patient Education  2024 [employer].

## 2023-09-06 LAB — URINALYSIS, COMPLETE
Bilirubin, UA: NEGATIVE
Glucose, UA: NEGATIVE
Ketones, UA: NEGATIVE
Leukocytes,UA: NEGATIVE
Nitrite, UA: NEGATIVE
Protein,UA: NEGATIVE
RBC, UA: NEGATIVE
Specific Gravity, UA: 1.02 (ref 1.005–1.030)
Urobilinogen, Ur: 0.2 mg/dL (ref 0.2–1.0)
pH, UA: 7 (ref 5.0–7.5)

## 2023-09-06 LAB — MICROSCOPIC EXAMINATION: Epithelial Cells (non renal): >10 /HPF — AB (ref 0–10)

## 2024-02-17 ENCOUNTER — Other Ambulatory Visit: Payer: Self-pay

## 2024-02-17 DIAGNOSIS — Z1231 Encounter for screening mammogram for malignant neoplasm of breast: Secondary | ICD-10-CM

## 2024-03-15 ENCOUNTER — Ambulatory Visit
Admission: RE | Admit: 2024-03-15 | Discharge: 2024-03-15 | Disposition: A | Discharge status: Home / Self Care | Source: Ambulatory Visit

## 2024-03-15 DIAGNOSIS — Z1231 Encounter for screening mammogram for malignant neoplasm of breast: Secondary | ICD-10-CM | POA: Insufficient documentation

## 2024-03-29 ENCOUNTER — Other Ambulatory Visit: Payer: Self-pay

## 2024-03-29 DIAGNOSIS — R928 Other abnormal and inconclusive findings on diagnostic imaging of breast: Secondary | ICD-10-CM

## 2024-04-04 ENCOUNTER — Ambulatory Visit
Admission: RE | Admit: 2024-04-04 | Discharge: 2024-04-04 | Disposition: A | Discharge status: Home / Self Care | Source: Ambulatory Visit

## 2024-04-04 DIAGNOSIS — R928 Other abnormal and inconclusive findings on diagnostic imaging of breast: Secondary | ICD-10-CM | POA: Insufficient documentation

## 2024-04-16 ENCOUNTER — Ambulatory Visit: Admitting: Physical Therapy

## 2024-04-18 ENCOUNTER — Ambulatory Visit: Admitting: Physical Therapy

## 2024-04-23 ENCOUNTER — Ambulatory Visit: Admitting: Physical Therapy

## 2024-04-25 ENCOUNTER — Ambulatory Visit: Admitting: Physical Therapy

## 2024-04-30 NOTE — Therapy (Unsigned)
 OUTPATIENT PHYSICAL THERAPY LOWER EXTREMITY EVALUATION   Patient Name: Sabrina Castro MRN: [id number] DOB:07-12-1960, 63 y.o., female Today's Date: 04/30/2024  END OF SESSION:   Past Medical History:  Diagnosis Date   Depression    GERD (gastroesophageal reflux disease)    Hypertension    Past Surgical History:  Procedure Laterality Date   CHOLECYSTECTOMY OPEN     COLONOSCOPY WITH PROPOFOL  N/A 01/09/2016   Procedure: COLONOSCOPY WITH PROPOFOL ;  Surgeon: Gladis RAYMOND Mariner, MD;  Location: [facility] ENDOSCOPY;  Service: Endoscopy;  Laterality: N/A;   ESOPHAGOGASTRODUODENOSCOPY (EGD) WITH PROPOFOL  N/A 01/09/2016   Procedure: ESOPHAGOGASTRODUODENOSCOPY (EGD) WITH PROPOFOL ;  Surgeon: Gladis RAYMOND Mariner, MD;  Location: [facility] ENDOSCOPY;  Service: Endoscopy;  Laterality: N/A;   Patient Active Problem List   Diagnosis Date Noted   Spondylosis without myelopathy or radiculopathy, cervical region 03/27/2018   Cervical facet syndrome (Right) 03/27/2018   Elevated blood sugar level 03/08/2018   Recurrent major depressive disorder, in partial remission 03/08/2018   Cervical radiculitis 01/18/2018   Abnormal x-ray of cervical spine 01/05/2018   DDD (degenerative disc disease), cervical 01/05/2018   Cervicalgia (Right) 01/03/2018   Myofascial pain 01/03/2018   Lumbar facet syndrome (Bilateral) (L>R) 11/17/2017   Chronic low back pain (Primary Area of Pain) (Bilateral) (L>R) w/o sciatica 11/17/2017   Lumbar spine instability 11/07/2017   Anterolisthesis (L2-3 and L4-5) 11/02/2017   Lumbar facet arthropathy (L2-3, L3-4, L4-5) (Bilateral) 11/02/2017   Lumbar foraminal stenosis (Right: L2-3) (Bilateral: L4-5) 11/02/2017   Lumbar lateral recess stenosis (Right: L2-3) (Bilateral: L4-5) 11/02/2017   Abnormal MRI, lumbar spine 11/02/2017   Osteoarthritis of hips (Bilateral) 11/02/2017   Osteoarthritis of knees (Bilateral) 11/02/2017   Spondylosis without myelopathy or radiculopathy, lumbosacral region  11/02/2017   Other specified dorsopathies, sacral and sacrococcygeal region 11/02/2017   Elevated C-reactive protein (CRP) 11/02/2017   Neurogenic pain 11/02/2017   GERD (gastroesophageal reflux disease) 09/13/2017   Polyneuropathy 09/13/2017   Tachycardia 09/13/2017   Chronic low back pain (Primary Area of Pain) (Bilateral) (Midline) (L>R) w/ sciatica (Bilateral) 09/13/2017   Chronic hip pain (Secondary Area of Pain) (Bilateral) (L>R) 09/13/2017   Chronic lower extremity pain (Tertiary Area of Pain) (Bilateral) (L>R) 09/13/2017   Chronic knee pain (Fourth Area of Pain) (Bilateral) (R>L) 09/13/2017   Chronic pain syndrome 09/13/2017   Long term current use of opiate analgesic 09/13/2017   Pharmacologic therapy 09/13/2017   Disorder of skeletal system 09/13/2017   Problems influencing health status 09/13/2017   Chronic sacroiliac joint pain 09/13/2017   Depression 08/25/2017   DDD (degenerative disc disease), lumbar 07/13/2017   Elevated erythrocyte sedimentation rate 07/13/2017   Numbness and tingling of foot 07/13/2017   Morbid obesity with BMI of 50.0-59.9, adult (HCC) 06/29/2017   Generalized osteoarthritis of multiple sites 05/11/2017   History of tachycardia 05/11/2017   Type 2 diabetes mellitus without complication, without long-term current use of insulin (HCC) 05/11/2017   Morbid obesity with BMI of 40.0-44.9, adult (HCC) 05/11/2017   Other fecal abnormalities 08/15/2015   Pain in right knee 08/07/2015   Anemia 07/03/2015   Other general symptoms and signs 07/03/2015   Prediabetes 07/03/2015   Allergic rhinitis 08/21/2014   Acute maxillary sinusitis, unspecified 02/19/2014   Other specified anxiety disorders 01/30/2013   Vitamin D  deficiency, unspecified 12/10/2011   Encounter for other general examination 12/06/2011   Encounter for screening mammogram for malignant neoplasm of breast 12/06/2011   Essential (primary) hypertension 12/06/2011   Obesity, unspecified  12/06/2011   Obstructive  sleep apnea 12/06/2011    PCP: none  REFERRING PROVIDER: Fleeta Dunning, Rocky, MD  REFERRING DIAG: Chronic hip pain, bilateral  THERAPY DIAG:  No diagnosis found.  Rationale for Evaluation and Treatment: Rehabilitation  ONSET DATE: ***  SUBJECTIVE:   SUBJECTIVE STATEMENT: ***  PERTINENT HISTORY: *** PAIN:  Are you having pain? Yes: NPRS scale: *** Pain location: *** Pain description: *** Aggravating factors: *** Relieving factors: ***  PRECAUTIONS: {Therapy precautions:24002}  RED FLAGS: {PT Red Flags:29287}   WEIGHT BEARING RESTRICTIONS: No  FALLS:  Has patient fallen in last 6 months? {fallsyesno:27318}  LIVING ENVIRONMENT: Lives with: {[facility] lives with:25569::lives with their family} Lives in: {Lives in:25570} Stairs: {opstairs:27293} Has following equipment at home: {Assistive devices:23999}  OCCUPATION: ***  PLOF: {PLOF:24004}  PATIENT GOALS: ***  NEXT MD VISIT: ***  OBJECTIVE:  Note: Objective measures were completed at Evaluation unless otherwise noted.  DIAGNOSTIC FINDINGS:   09/13/2017- XRAY R HIP  DG HIP (WITH OR WITHOUT PELVIS) 2-3V RIGHT   COMPARISON:  None.   FINDINGS: Mild degenerative change in the right hip joint with joint space narrowing and mild spurring. Negative for fracture or AVN. No acute skeletal abnormality.   IMPRESSION: Mild degenerative change right hip  09/13/2017- XRAY L HIP DG HIP (WITH OR WITHOUT PELVIS) 2-3V LEFT   COMPARISON:  None.   FINDINGS: Normal alignment. No fracture or mass. No AVN. Mild degenerative changes in the left hip joint with mild joint space narrowing and spurring.   IMPRESSION: Mild degenerative change left hip without acute abnormality.  Extreme difficulty/unable (0), Quite a bit of difficulty (1), Moderate difficulty (2), Little difficulty (3), No difficulty (4) Survey date:  05/01/2024  Any of your usual work, housework or school activities   2. Usual  hobbies, recreational or sporting activities   3. Getting into/out of the bath   4. Walking between rooms   5. Putting on socks/shoes   6. Squatting    7. Lifting an object, like a bag of groceries from the floor   8. Performing light activities around your home   9. Performing heavy activities around your home   10. Getting into/out of a car   11. Walking 2 blocks   12. Walking 1 mile   13. Going up/down 10 stairs (1 flight)   14. Standing for 1 hour   15.  sitting for 1 hour   16. Running on even ground   17. Running on uneven ground   18. Making sharp turns while running fast   19. Hopping    20. Rolling over in bed   Score total:  ***   COGNITION: Overall cognitive status: {cognition:24006}     SENSATION: {sensation:27233}  POSTURE: {posture:25561}  PALPATION: ***  LOWER EXTREMITY ROM:  Active ROM Right eval Left eval  Hip flexion    Hip extension    Hip abduction    Hip adduction    Hip internal rotation    Hip external rotation     (Blank rows = not tested)  LOWER EXTREMITY MMT:  MMT Right eval Left eval  Hip flexion    Hip abduction    Knee flexion    Knee extension      LOWER EXTREMITY SPECIAL TESTS:  FABER  R=  L= FADDIR  R=  L= External derotation Test  R=  L=  FUNCTIONAL TESTS:  {Functional tests:24029}  GAIT: Distance walked: *** Assistive device utilized: {Assistive devices:23999} Level of assistance: {Levels of assistance:24026} Comments: ***  TREATMENT : ***    PATIENT EDUCATION:  Education details: Exercise purpose/form. Self management techniques. Education on diagnosis, prognosis, POC, anatomy and physiology of current condition. Education on HEP including handout. Person educated: Patient Education method: Explanation, Demonstration, Tactile cues, Verbal cues, and Handouts Education comprehension: verbalized  understanding, returned demonstration, verbal cues required, and needs further education  HOME EXERCISE PROGRAM: ***  ASSESSMENT:  CLINICAL IMPRESSION: Patient is a 63 y.o. female referred to outpatient physical therapy with a medical diagnosis of *** who presents with signs and symptoms consistent with ***. Patient presents with significant *** impairments that are limiting ability to complete *** without difficulty. Patient will benefit from skilled physical therapy intervention to address current body structure impairments and activity limitations to improve function and work towards goals set in current POC in order to return to prior level of function or maximal functional improvement.   Mechanical sensitivities: ***   OBJECTIVE IMPAIRMENTS: {opptimpairments:25111}.   ACTIVITY LIMITATIONS: {activitylimitations:27494}  PARTICIPATION LIMITATIONS: {participationrestrictions:25113}  PERSONAL FACTORS: {Personal factors:25162} are also affecting patient's functional outcome.   REHAB POTENTIAL: {rehabpotential:25112}  CLINICAL DECISION MAKING: {clinical decision making:25114}  EVALUATION COMPLEXITY: {Evaluation complexity:25115}   GOALS: Goals reviewed with patient? No  SHORT TERM GOALS: Target date: 05/14/2024  Patient will be independent with initial home exercise program for self-management of symptoms. Baseline: {HEPbaseline4:27310} (04/30/24); Goal status: INITIAL  LONG TERM GOALS: Target date: 07/23/2024  Patient will be independent with a long-term home exercise program for self-management of symptoms.  Baseline: {HEPbaseline4:27310} (04/30/24); Goal status: INITIAL  2.  Patient will demonstrate improved {SarasLTGPRO:32233} to demonstrate improvement in overall condition and self-reported functional ability.  Baseline: {Sarasgoalbaseline:32234} (04/30/24); Goal status: INITIAL  3.  *** Baseline: {Sarasgoalbaseline:32234} (04/30/24); Goal status: INITIAL  4.   *** Baseline: {Sarasgoalbaseline:32234} (04/30/24); Goal status: INITIAL  5.  Patient will demonstrate improvement in Patient Specific Functional Scale (PSFS) of equal or greater than 8/10 points to reflect clinically significant improvement in patient's most valued functional activities. Baseline: {Sarasgoalbaseline:32234} (04/30/24); Goal status: INITIAL  6.  Patient will report NPRS equal or less than 3/10 during functional activities during the last 2 weeks to improve their abilitly to complete community, work and/or recreational activities with less limitation. Baseline: ***/10 (04/30/24); Goal status: INITIAL  PLAN:  PT FREQUENCY: 2x/week  PT DURATION: 12 weeks  PLANNED INTERVENTIONS: 97164- PT Re-evaluation, 97110-Therapeutic exercises, 97530- Therapeutic activity, 97112- Neuromuscular re-education, 97535- Self Care, 02859- Manual therapy, [phone]- Gait training, [phone]- Electrical stimulation (unattended), 20560 (1-2 muscles), 20561 (3+ muscles)- Dry Needling, Patient/Family education, Balance training, Joint mobilization, Cryotherapy, and Moist heat  PLAN FOR NEXT SESSION: ***   Vernell Rise, SPT 04/30/2024, 9:32 AM

## 2024-04-30 NOTE — Progress Notes (Signed)
 Sinus drainage,congestion and cough x 1 week

## 2024-05-01 ENCOUNTER — Ambulatory Visit: Admitting: Physical Therapy

## 2024-05-01 ENCOUNTER — Encounter: Payer: Self-pay | Admitting: Physical Therapy

## 2024-05-01 ENCOUNTER — Ambulatory Visit: Attending: Internal Medicine | Admitting: Physical Therapy

## 2024-05-01 DIAGNOSIS — M5459 Other low back pain: Secondary | ICD-10-CM | POA: Diagnosis present

## 2024-05-01 DIAGNOSIS — M25551 Pain in right hip: Secondary | ICD-10-CM | POA: Insufficient documentation

## 2024-05-01 DIAGNOSIS — M25552 Pain in left hip: Secondary | ICD-10-CM | POA: Insufficient documentation

## 2024-05-01 DIAGNOSIS — M6281 Muscle weakness (generalized): Secondary | ICD-10-CM | POA: Insufficient documentation

## 2024-05-02 NOTE — Therapy (Deleted)
 OUTPATIENT PHYSICAL THERAPY EVALUATION   Patient Name: Sabrina Castro MRN: [id number] DOB:[birth year], 62 y.o., female Today's Date: 05/02/2024  END OF SESSION:   Past Medical History:  Diagnosis Date   Depression    GERD (gastroesophageal reflux disease)    Hypertension    Past Surgical History:  Procedure Laterality Date   CHOLECYSTECTOMY OPEN     COLONOSCOPY WITH PROPOFOL  N/A 01/09/2016   Procedure: COLONOSCOPY WITH PROPOFOL ;  Surgeon: Gladis RAYMOND Mariner, MD;  Location: [facility] [place] ENDOSCOPY;  Service: Endoscopy;  Laterality: N/A;   ESOPHAGOGASTRODUODENOSCOPY (EGD) WITH PROPOFOL  N/A 01/09/2016   Procedure: ESOPHAGOGASTRODUODENOSCOPY (EGD) WITH PROPOFOL ;  Surgeon: Gladis RAYMOND Mariner, MD;  Location: [facility] ENDOSCOPY;  Service: Endoscopy;  Laterality: N/A;   Patient Active Problem List   Diagnosis Date Noted   Spondylosis without myelopathy or radiculopathy, cervical region 03/27/2018   Cervical facet syndrome (Right) 03/27/2018   Elevated blood sugar level 03/08/2018   Recurrent major depressive disorder, in partial remission 03/08/2018   Cervical radiculitis 01/18/2018   Abnormal x-ray of cervical spine 01/05/2018   DDD (degenerative disc disease), cervical 01/05/2018   Cervicalgia (Right) 01/03/2018   Myofascial pain 01/03/2018   Lumbar facet syndrome (Bilateral) (L>R) 11/17/2017   Chronic low back pain (Primary Area of Pain) (Bilateral) (L>R) w/o sciatica 11/17/2017   Lumbar spine instability 11/07/2017   Anterolisthesis (L2-3 and L4-5) 11/02/2017   Lumbar facet arthropathy (L2-3, L3-4, L4-5) (Bilateral) 11/02/2017   Lumbar foraminal stenosis (Right: L2-3) (Bilateral: L4-5) 11/02/2017   Lumbar lateral recess stenosis (Right: L2-3) (Bilateral: L4-5) 11/02/2017   Abnormal MRI, lumbar spine 11/02/2017   Osteoarthritis of hips (Bilateral) 11/02/2017   Osteoarthritis of knees (Bilateral) 11/02/2017   Spondylosis without myelopathy or radiculopathy, lumbosacral region 11/02/2017   Other  specified dorsopathies, sacral and sacrococcygeal region 11/02/2017   Elevated C-reactive protein (CRP) 11/02/2017   Neurogenic pain 11/02/2017   GERD (gastroesophageal reflux disease) 09/13/2017   Polyneuropathy 09/13/2017   Tachycardia 09/13/2017   Chronic low back pain (Primary Area of Pain) (Bilateral) (Midline) (L>R) w/ sciatica (Bilateral) 09/13/2017   Chronic hip pain (Secondary Area of Pain) (Bilateral) (L>R) 09/13/2017   Chronic lower extremity pain (Tertiary Area of Pain) (Bilateral) (L>R) 09/13/2017   Chronic knee pain (Fourth Area of Pain) (Bilateral) (R>L) 09/13/2017   Chronic pain syndrome 09/13/2017   Long term current use of opiate analgesic 09/13/2017   Pharmacologic therapy 09/13/2017   Disorder of skeletal system 09/13/2017   Problems influencing health status 09/13/2017   Chronic sacroiliac joint pain 09/13/2017   Depression 08/25/2017   DDD (degenerative disc disease), lumbar 07/13/2017   Elevated erythrocyte sedimentation rate 07/13/2017   Numbness and tingling of foot 07/13/2017   Morbid obesity with BMI of 50.0-59.9, adult (HCC) 06/29/2017   Generalized osteoarthritis of multiple sites 05/11/2017   History of tachycardia 05/11/2017   Type 2 diabetes mellitus without complication, without long-term current use of insulin (HCC) 05/11/2017   Morbid obesity with BMI of 40.0-44.9, adult (HCC) 05/11/2017   Other fecal abnormalities 08/15/2015   Pain in right knee 08/07/2015   Anemia 07/03/2015   Other general symptoms and signs 07/03/2015   Prediabetes 07/03/2015   Allergic rhinitis 08/21/2014   Acute maxillary sinusitis, unspecified 02/19/2014   Other specified anxiety disorders 01/30/2013   Vitamin D  deficiency, unspecified 12/10/2011   Encounter for other general examination 12/06/2011   Encounter for screening mammogram for malignant neoplasm of breast 12/06/2011   Essential (primary) hypertension 12/06/2011   Obesity, unspecified 12/06/2011   Obstructive  sleep apnea  12/06/2011   PCP: none  REFERRING PROVIDER: Fleeta Dunning, Rocky, MD  REFERRING DIAG: Chronic hip pain, bilateral  THERAPY DIAG:  Pain in left hip  Pain in right hip  Muscle weakness (generalized)  Other low back pain  Rationale for Evaluation and Treatment: Rehabilitation  ONSET DATE: First onset date in 2019 and in September of 2025 got bad again  SUBJECTIVE:   PERTINENT HISTORY: Patient is a 63 y.o. female who presents to outpatient physical therapy with a referral for medical diagnosis bilateral chronic hip pain. This patient's chief complaints consist of bilateral throbbing, deep hip pain with the L being worse than the R and decreased balance leading to the following functional deficits: decreased ability to stand/walk for prolonged periods of time, work, and difficulty getting into and out of the car. 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), Allergic rhinitis; Anemia; DDD (degenerative disc disease), lumbar; Depression; Elevated erythrocyte sedimentation rate; Essential (primary) hypertension; Generalized osteoarthritis of multiple sites; GERD (gastroesophageal reflux disease); Morbid obesity with BMI of 50.0-59.9, adult (HCC); Numbness and tingling of foot; Obstructive sleep apnea; Other specified anxiety disorders; Pain in right knee; Polyneuropathy; Prediabetes; Tachycardia; Type 2 diabetes mellitus without complication, without long-term current use of insulin (HCC); Vitamin D  deficiency, unspecified; Chronic low back pain (Primary Area of Pain) (Bilateral) (Midline) (L>R) w/ sciatica (Bilateral); Chronic hip pain (Secondary Area of Pain) (Bilateral) (L>R); Chronic lower extremity pain (Tertiary Area of Pain) (Bilateral) (L>R); Chronic knee  pain (Fourth Area of Pain) (Bilateral) (R>L); Chronic pain syndrome; Long term current use of opiate analgesic; Chronic sacroiliac joint pain; Anterolisthesis (L2-3 and L4-5); Lumbar facet arthropathy (L2-3, L3-4, L4-5) (Bilateral); Lumbar foraminal stenosis (Right: L2-3) (Bilateral: L4-5); Lumbar lateral recess stenosis (Right: L2-3) (Bilateral: L4-5); Osteoarthritis of hips (Bilateral); Osteoarthritis of knees (Bilateral); Spondylosis without myelopathy or radiculopathy, lumbosacral region; Other specified dorsopathies, sacral and sacrococcygeal region; Elevated C-reactive protein (CRP); Neurogenic pain; Lumbar spine instability; Lumbar facet syndrome (Bilateral) (L>R); Chronic low back pain (Primary Area of Pain) (Bilateral) (L>R) w/o sciatica; Cervicalgia (Right); Myofascial pain; DDD (degenerative disc disease), cervical; Cervical radiculitis; Elevated blood sugar level; Recurrent major depressive disorder, in partial remission; Spondylosis without myelopathy or radiculopathy, cervical region; and Cervical facet syndrome (Right) on their problem list.  she  has a past surgical history that includes Cholecystectomy open. Patient denies hx of cancer, stroke, seizures, lung problems, unexplained changes in bowel or bladder problems, unexplained stumbling or dropping things, and spinal surgery  SUBJECTIVE STATEMENT: *** PAIN:   NPRS: Current: ***  From Initial Eval: Are you having pain? Yes: NPRS scale: 1/10  Pain location: deep in her hip joints, lateral side  Pain description: grinding, throbbing, feels like a broken a bone (L side) Worst: 7-8/10 Aggravating factors: bending over, getting in and out of the car (R leg), standing long periods time Best: 0/10 Relieving factors: nerve ablations and sitting  PRECAUTIONS: fall  RED FLAGS: None   WEIGHT BEARING RESTRICTIONS: No  FALLS:  Has patient fallen in last 6 months? No  OCCUPATION: on disability since 2019 from spinal stenosis.  PLOF:  Independent with community mobility with device (cane around the house when hips are very bad) (rollator when going out and isn't sure how long she has to be standing for)  PATIENT GOALS: better balance  NEXT MD VISIT: upon  completion of PT  OBJECTIVE:  Note: Objective measures were completed at Evaluation unless otherwise noted.  SELF-REPORTED FUNCTION Patient Specific Functional Scale (PSFS)  ***: *** ***: *** ***: *** Average: ***  There were no vitals filed for this visit.  FUNCTIONAL TESTS:  6-Minute Walk Test ( ): Purpose: to assess function with community mobility Results: *** feet with ***. Analysis: patient ambulated *** their long term goal of 1500 feet, which is closer to the age matched norm for a healthy community dwelling adult females which is 1765 feet in 6 minutes.   Neural Tension Testing Straight Leg Raise:  From supine R  = *** L  = *** With pre-flexed hip/knee R = *** L = ***  HIP SPECIAL TESTS:  Scour Test   R=   L=                                                                                                  TREATMENT :    Physical Performance Test or Measurement: a physical performance test or measurement with written report.  6 minute walk test to test symptom aggravation after prolonged walking and get a baseline (see above)  Therapeutic activities: dynamic therapeutic activities incorporating MULTIPLE parameters or areas of the body designed to achieve improved functional performance.  Standing resisted hip abductions with RTB around ankles to improve hip strength and single leg stability for walking   1 x 10 each leg Patient noted she felt her muscle working under R buttock when doing L hip abductions  Objective measurements as well as vitals for systems review taken (see above)  PATIENT EDUCATION:  Education details: Exercise purpose/form. Self management techniques. Education on diagnosis, prognosis, POC, anatomy and physiology  of current condition. Education on HEP including handout. Person educated: Patient Education method: Explanation, Demonstration, Verbal cues, and Handouts Education comprehension: verbalized understanding, returned demonstration, verbal cues required, and needs further education  HOME EXERCISE PROGRAM: Access Code: [email or web address] URL: [email or web address] Date: 05/01/2024 Prepared by: Vernell Rise  Exercises - Standing Hip Abduction with Resistance at Ankles and Counter Support  - 1 x daily - 2 sets - 10 reps  ASSESSMENT:  CLINICAL IMPRESSION: ***  From Initial Eval: Patient is a 63 y.o. female referred to outpatient physical therapy with a medical diagnosis of chronic bilateral hip pain who presents with signs and symptoms consistent with potential R sided FAI and potential labral involvement as well L sided gluteal tendinopathy. A lateral catch during the FABER and FADIR test on the R side with a catch suggests femoracetabular impingement however the location of the catch can suggest labral involvement or snapping hip syndrome. The negative external derotation test on the R can suggest an absence of gluteal tendinopathy, however the location of pain suggests otherwise. Will complete a scour test next visit to determine if there is labral involvement on the R side. Will also complete hip traction to determine if the symptoms are extra-articular vs intra-articular on both hips. Patient has a history of chronic low back and SI joint pain which could also be the cause of some of her hip  pain. Patient presents with significant ROM, strength, gait and balance impairments that are limiting ability to complete stand and walk for long periods of time, get into and out of the car and work without difficulty. Patient will benefit from skilled physical therapy intervention to address current body structure impairments and activity limitations to improve function and work towards goals set in  current POC in order to return to prior level of function or maximal functional improvement.   OBJECTIVE IMPAIRMENTS: Abnormal gait, decreased activity tolerance, decreased balance, decreased endurance, decreased knowledge of condition, decreased mobility, difficulty walking, decreased strength, obesity, and pain.   ACTIVITY LIMITATIONS: carrying, lifting, bending, standing, squatting, stairs, dressing, locomotion level, and getting into and out of the car  PARTICIPATION LIMITATIONS: cleaning, laundry, shopping, community activity, occupation, and getting in and out of the car  PERSONAL FACTORS: Past/current experiences, Time since onset of injury/illness/exacerbation, and 3+ comorbidities: GERD,Chronic sacroiliac joint pain; Anterolisthesis (L2-3 and L4-5); Lumbar facet arthropathy (L2-3, L3-4, L4-5) (Bilateral); Lumbar foraminal stenosis (Right: L2-3) (Bilateral: L4-5); Lumbar lateral recess stenosis (Right: L2-3) (Bilateral: L4-5), HTN are also affecting patient's functional outcome.   REHAB POTENTIAL: Good  CLINICAL DECISION MAKING: Evolving/moderate complexity  EVALUATION COMPLEXITY: Moderate  GOALS: Goals reviewed with patient? No  SHORT TERM GOALS: Target date: 05/15/2024  Patient will be independent with initial home exercise program for self-management of symptoms. Baseline: Initial HEP provided at IE (05/01/24); Goal status: INITIAL  LONG TERM GOALS: Target date: 07/24/2024  Patient will be independent with a long-term home exercise program for self-management of symptoms.  Baseline: Initial HEP provided at IE (05/01/24); Goal status: INITIAL  2.  Patient will demonstrate improved Lower Extremity Functional Scale (LEFS) to equal or greater than 64/80 to demonstrate improvement in overall condition and self-reported functional ability.  Baseline: 22/80 (05/01/24); Goal status: INITIAL  3.  Patient will demonstrate the ability to ambulate 1500 feet with least restrictive  assistive device in 6 minutes to demonstrate improved community ambulation closer to age matched norms for healthy community dwelling adult females (1765 feet).  Baseline: to be measured at visit 2 as appropriate (05/01/24); Goal status: INITIAL  4.  Patient will demonstrate full functional hip ROM to be able to get into and out of the car with decreased difficulty. Baseline: PROM measured (see above) (05/01/24); Goal status: INITIAL  5.  Patient will demonstrate improvement in Patient Specific Functional Scale (PSFS) of equal or greater than 8/10 points to reflect clinically significant improvement in patient's most valued functional activities. Baseline: to be measured at visit 2 as appropriate (05/01/24); Goal status: INITIAL  6.  Patient will report NPRS equal or less than 3/10 during functional activities during the last 2 weeks to improve their abilitly to complete community, work and/or recreational activities with less limitation. Baseline: 7-8/10 (05/01/24); Goal status: INITIAL  PLAN:  PT FREQUENCY: 2x/week  PT DURATION: 12 weeks  PLANNED INTERVENTIONS: 97164- PT Re-evaluation, 97750- Physical Performance Testing, 97110-Therapeutic exercises, 97530- Therapeutic activity, [id number]- Neuromuscular re-education, 97535- Self Care, [id number]- Manual therapy, [id number]- Gait training, [phone]- Electrical stimulation (unattended), 20560 (1-2 muscles), 20561 (3+ muscles)- Dry Needling, Patient/Family education, Balance training, Joint mobilization, Cryotherapy, and Moist heat  PLAN FOR NEXT SESSION: ***complete PSFS, take BP, Straight leg raise test, scour test and . Update HEP as appropriate, Manual therapy as need. Consider traction of hips.  Vernell Rise, SPT 05/02/2024, 1:36 PM

## 2024-05-03 ENCOUNTER — Telehealth: Payer: Self-pay | Admitting: Physical Therapy

## 2024-05-03 ENCOUNTER — Ambulatory Visit: Admitting: Physical Therapy

## 2024-05-03 NOTE — Therapy (Signed)
 OUTPATIENT PHYSICAL THERAPY TREATMENT   Patient Name: Sabrina Castro MRN: [id number] DOB:10-Jul-1960, 63 y.o., female Today's Date: 05/07/2024  END OF SESSION:  PT End of Session - 05/07/24 1345     Visit Number 2    Number of Visits 25    Date for Recertification  07/24/24    Authorization Type [employer] MEDICARE reporting period from 05/01/2024    Progress Note Due on Visit 10    PT Start Time 1345    PT Stop Time 1429    PT Time Calculation (min) 44 min    Activity Tolerance Patient tolerated treatment well    Behavior During Therapy [facility] for tasks assessed/performed         Past Medical History:  Diagnosis Date   Depression    GERD (gastroesophageal reflux disease)    Hypertension    Past Surgical History:  Procedure Laterality Date   CHOLECYSTECTOMY OPEN     COLONOSCOPY WITH PROPOFOL  N/A 01/09/2016   Procedure: COLONOSCOPY WITH PROPOFOL ;  Surgeon: Gladis RAYMOND Mariner, MD;  Location: [facility] ENDOSCOPY;  Service: Endoscopy;  Laterality: N/A;   ESOPHAGOGASTRODUODENOSCOPY (EGD) WITH PROPOFOL  N/A 01/09/2016   Procedure: ESOPHAGOGASTRODUODENOSCOPY (EGD) WITH PROPOFOL ;  Surgeon: Gladis RAYMOND Mariner, MD;  Location: [facility] ENDOSCOPY;  Service: Endoscopy;  Laterality: N/A;   Patient Active Problem List   Diagnosis Date Noted   Spondylosis without myelopathy or radiculopathy, cervical region 03/27/2018   Cervical facet syndrome (Right) 03/27/2018   Elevated blood sugar level 03/08/2018   Recurrent major depressive disorder, in partial remission 03/08/2018   Cervical radiculitis 01/18/2018   Abnormal x-ray of cervical spine 01/05/2018   DDD (degenerative disc disease), cervical 01/05/2018   Cervicalgia (Right) 01/03/2018   Myofascial pain 01/03/2018   Lumbar facet syndrome (Bilateral) (L>R) 11/17/2017   Chronic low back pain (Primary Area of Pain) (Bilateral) (L>R) w/o sciatica 11/17/2017   Lumbar spine instability 11/07/2017   Anterolisthesis (L2-3 and L4-5) 11/02/2017   Lumbar facet  arthropathy (L2-3, L3-4, L4-5) (Bilateral) 11/02/2017   Lumbar foraminal stenosis (Right: L2-3) (Bilateral: L4-5) 11/02/2017   Lumbar lateral recess stenosis (Right: L2-3) (Bilateral: L4-5) 11/02/2017   Abnormal MRI, lumbar spine 11/02/2017   Osteoarthritis of hips (Bilateral) 11/02/2017   Osteoarthritis of knees (Bilateral) 11/02/2017   Spondylosis without myelopathy or radiculopathy, lumbosacral region 11/02/2017   Other specified dorsopathies, sacral and sacrococcygeal region 11/02/2017   Elevated C-reactive protein (CRP) 11/02/2017   Neurogenic pain 11/02/2017   GERD (gastroesophageal reflux disease) 09/13/2017   Polyneuropathy 09/13/2017   Tachycardia 09/13/2017   Chronic low back pain (Primary Area of Pain) (Bilateral) (Midline) (L>R) w/ sciatica (Bilateral) 09/13/2017   Chronic hip pain (Secondary Area of Pain) (Bilateral) (L>R) 09/13/2017   Chronic lower extremity pain (Tertiary Area of Pain) (Bilateral) (L>R) 09/13/2017   Chronic knee pain (Fourth Area of Pain) (Bilateral) (R>L) 09/13/2017   Chronic pain syndrome 09/13/2017   Long term current use of opiate analgesic 09/13/2017   Pharmacologic therapy 09/13/2017   Disorder of skeletal system 09/13/2017   Problems influencing health status 09/13/2017   Chronic sacroiliac joint pain 09/13/2017   Depression 08/25/2017   DDD (degenerative disc disease), lumbar 07/13/2017   Elevated erythrocyte sedimentation rate 07/13/2017   Numbness and tingling of foot 07/13/2017   Morbid obesity with BMI of 50.0-59.9, adult (HCC) 06/29/2017   Generalized osteoarthritis of multiple sites 05/11/2017   History of tachycardia 05/11/2017   Type 2 diabetes mellitus without complication, without long-term current use of insulin (HCC) 05/11/2017   Morbid obesity with  BMI of 40.0-44.9, adult (HCC) 05/11/2017   Other fecal abnormalities 08/15/2015   Pain in right knee 08/07/2015   Anemia 07/03/2015   Other general symptoms and signs 07/03/2015    Prediabetes 07/03/2015   Allergic rhinitis 08/21/2014   Acute maxillary sinusitis, unspecified 02/19/2014   Other specified anxiety disorders 01/30/2013   Vitamin D  deficiency, unspecified 12/10/2011   Encounter for other general examination 12/06/2011   Encounter for screening mammogram for malignant neoplasm of breast 12/06/2011   Essential (primary) hypertension 12/06/2011   Obesity, unspecified 12/06/2011   Obstructive sleep apnea 12/06/2011   PCP: none  REFERRING PROVIDER: Fleeta Dunning, Rocky, MD  REFERRING DIAG: Chronic hip pain, bilateral  THERAPY DIAG:  Pain in left hip  Pain in right hip  Muscle weakness (generalized)  Other low back pain  Rationale for Evaluation and Treatment: Rehabilitation  ONSET DATE: First onset date in 2019 and in September of 2025 got bad again  SUBJECTIVE:   PERTINENT HISTORY: Patient is a 63 y.o. female who presents to outpatient physical therapy with a referral for medical diagnosis bilateral chronic hip pain. This patient's chief complaints consist of bilateral throbbing, deep hip pain with the L being worse than the R and decreased balance leading to the following functional deficits: decreased ability to stand/walk for prolonged periods of time, work, and difficulty getting into and out of the car. 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), Allergic rhinitis; Anemia; DDD (degenerative disc disease), lumbar; Depression; Elevated erythrocyte sedimentation rate; Essential (primary) hypertension; Generalized osteoarthritis of multiple sites; GERD (gastroesophageal reflux disease); Morbid obesity with BMI of 50.0-59.9, adult (HCC); Numbness and tingling of foot; Obstructive sleep apnea; Other specified anxiety disorders;  Pain in right knee; Polyneuropathy; Prediabetes; Tachycardia; Type 2 diabetes mellitus without complication, without long-term current use of insulin (HCC); Vitamin D  deficiency, unspecified; Chronic low back pain (Primary Area of Pain) (Bilateral) (Midline) (L>R) w/ sciatica (Bilateral); Chronic hip pain (Secondary Area of Pain) (Bilateral) (L>R); Chronic lower extremity pain (Tertiary Area of Pain) (Bilateral) (L>R); Chronic knee pain (Fourth Area of Pain) (Bilateral) (R>L); Chronic pain syndrome; Long term current use of opiate analgesic; Chronic sacroiliac joint pain; Anterolisthesis (L2-3 and L4-5); Lumbar facet arthropathy (L2-3, L3-4, L4-5) (Bilateral); Lumbar foraminal stenosis (Right: L2-3) (Bilateral: L4-5); Lumbar lateral recess stenosis (Right: L2-3) (Bilateral: L4-5); Osteoarthritis of hips (Bilateral); Osteoarthritis of knees (Bilateral); Spondylosis without myelopathy or radiculopathy, lumbosacral region; Other specified dorsopathies, sacral and sacrococcygeal region; Elevated C-reactive protein (CRP); Neurogenic pain; Lumbar spine instability; Lumbar facet syndrome (Bilateral) (L>R); Chronic low back pain (Primary Area of Pain) (Bilateral) (L>R) w/o sciatica; Cervicalgia (Right); Myofascial pain; DDD (degenerative disc disease), cervical; Cervical radiculitis; Elevated blood sugar level; Recurrent major depressive disorder, in partial remission; Spondylosis without myelopathy or radiculopathy, cervical region; and Cervical facet syndrome (Right) on their problem list.  she  has a past surgical history that includes Cholecystectomy open. Patient denies hx of cancer, stroke, seizures, lung problems, unexplained changes in bowel or bladder problems, unexplained stumbling or dropping things, and spinal surgery  SUBJECTIVE STATEMENT: Patient reported she felt good over the weekend and after session. Patient stated she has been feeling more symptoms on her R side than her L which is unsual for  her.  PAIN:   NPRS: Current: pt denies pain  From Initial Eval: Are you  having pain? Yes: NPRS scale: 1/10  Pain location: deep in her hip joints, lateral side  Pain description: grinding, throbbing, feels like a broken a bone (L side) Worst: 7-8/10 Aggravating factors: bending over, getting in and out of the car (R leg), standing long periods time Best: 0/10 Relieving factors: nerve ablations and sitting  PRECAUTIONS: fall  RED FLAGS: None   WEIGHT BEARING RESTRICTIONS: No  FALLS: Has patient fallen in last 6 months? No  OCCUPATION: on disability since 2019 from spinal stenosis.  PLOF: Independent with community mobility with device (cane around the house when hips are very bad) (rollator when going out and isn't sure how long she has to be standing for)  PATIENT GOALS: better balance and walk further without pain  NEXT MD VISIT: upon completion of PT  OBJECTIVE:   SELF-REPORTED FUNCTION Patient Specific Functional Scale (PSFS)  Walking a mile: 0/10 yard work: 1/10 Do activities with nephews: 0/10 Average: 0.33/10  Vitals:   05/07/24 1350  BP: (!) 126/57   FUNCTIONAL TESTS:  6-Minute Walk Test ( ): Purpose: to assess function with community mobility Results: 505 feet with SBA stopped with 1 minute 36 left in the test. Started feeling it in the L low back that went into her L hip and became wobbly Analysis: patient ambulated 510 their long term goal of 1500 feet, which is closer to the age matched norm for a healthy community dwelling adult females which is 1765 feet in 6 minutes.   Lumbar/Hip Differentiation Walking Test Ambulation with hips in IR:  R IR: worse R sided concordant pain L IR: worse L sided concordant pain Ambulation with hips in ER:  R ER: better R sided concordant pain  L ER: better L sided concordant pain Ambulation in lumbar flexion: felt better than upright but not the same relief of the same pain as she felt with hip ER; stated she  used to walk slumped to avoid pain. (Different pain).   NEURAL TENSION TESTING: Straight Leg Raise:  From supine R  = negative L  = negative With pre-flexed hip/knee R = negative, felt deep pain in R hip L = negative, felt deep pain in  R hip  HIP SPECIAL TESTS:  Scour Test   R= some popping with adduction and IR that she hasn't felt before.   L= negative  Ober Test   R= tightness in rec fem prevented from getting into extension   L= Tightness in rec fem prevented from getting into full extension                                                                                                 TREATMENT :    Physical Performance Test or Measurement: a physical performance test or measurement with written report.  6 minute walk test to test symptom aggravation after prolonged walking and get a baseline (see above) Started feeling it in the L low back that went into her L hip and became wobbly  Therapeutic exercise: therapeutic exercises that incorporate ONE parameter at one or more areas of the body to centralize symptoms,  develop strength and endurance, range of motion, and flexibility required for successful completion of functional activities.   Standing hip flexor stretch with R UE support on counter top   1 x 15 seconds each side   Added to HEP  Neurodynamic and hip special testes (see above)  Vitals for system's review (see above)  Supine bridges for glute strengthening   1 x 10   Added to HEP  Therapeutic activities: dynamic therapeutic activities incorporating MULTIPLE parameters or areas of the body designed to achieve improved functional performance.  Walking differentiation test (see above)   PATIENT EDUCATION:  Education details: Exercise purpose/form. Self management techniques. Education on diagnosis, prognosis, POC, anatomy and physiology of current condition. Education on HEP including handout. Person educated: Patient Education method: Explanation,  Demonstration, Verbal cues, and Handouts Education comprehension: verbalized understanding, returned demonstration, verbal cues required, and needs further education  HOME EXERCISE PROGRAM: Access Code: [email or web address] URL: [email or web address] Date: 05/07/2024 Prepared by: Vernell Rise  Exercises - Standing Hip Abduction with Resistance at Ankles and Counter Support  - 1 x daily - 2 sets - 10 reps - Supine Bridge  - 1 x daily - 2 sets - 10 reps - Standing Hip Flexor Stretch  - 1 x daily - 2 sets - 15 second hold  ASSESSMENT:  CLINICAL IMPRESSION: Completed a 6 minute walk test to get a baseline for goals. Patient required a standing rest break for 30 seconds during 4th lap due to increased L hip fatigue. Patient ambulated 505' in 4 minutes and 24 seconds before needing to take a seated break and end the test. Upon completion of the hip scour test, popping of R hip was found but patient noted it was a pain/sensation she hasn't felt before. Attempted ober testing to assess TFL but found the rec fem was too tight for hips to enter into extension. Completed ambulation testing with foot placed into IR and patient noted increase discomfort, when foot was placed in ER she stated it felt good on her hip as she didn't have any discomfort. This findings suggests the presence of an intraarticular pathology as she felt better with less pressure within the hip joint. When ambulating in lumbar flexed position the patient noted decrease in the upper hip/low back pain and stated she used to walk slumped to avoid her pain. This finding suggest a presence of an extension sensitivity within her low back. Added hip flexor stretching due to finding tightness when attempting to test the TFL. Patient stated she can tell she was working out by the discomfort in her L low back/hip but denies pain upon completion of session.  From Initial Eval: Patient is a 63 y.o. female referred to outpatient physical  therapy with a medical diagnosis of chronic bilateral hip pain who presents with signs and symptoms consistent with potential R sided FAI and potential labral involvement as well L sided gluteal tendinopathy. A lateral catch during the FABER and FADIR test on the R side with a catch suggests femoracetabular impingement however the location of the catch can suggest labral involvement or snapping hip syndrome. The negative external derotation test on the R can suggest an absence of gluteal tendinopathy, however the location of pain suggests otherwise. Will complete a scour test next visit to determine if there is labral involvement on the R side. Will also complete hip traction to determine if the symptoms are extra-articular vs intra-articular on both hips. Patient has a history of chronic low back and SI joint  pain which could also be the cause of some of her hip pain. Patient presents with significant ROM, strength, gait and balance impairments that are limiting ability to complete stand and walk for long periods of time, get into and out of the car and work without difficulty. Patient will benefit from skilled physical therapy intervention to address current body structure impairments and activity limitations to improve function and work towards goals set in current POC in order to return to prior level of function or maximal functional improvement.   OBJECTIVE IMPAIRMENTS: Abnormal gait, decreased activity tolerance, decreased balance, decreased endurance, decreased knowledge of condition, decreased mobility, difficulty walking, decreased strength, obesity, and pain.   ACTIVITY LIMITATIONS: carrying, lifting, bending, standing, squatting, stairs, dressing, locomotion level, and getting into and out of the car  PARTICIPATION LIMITATIONS: cleaning, laundry, shopping, community activity, occupation, and getting in and out of the car  PERSONAL FACTORS: Past/current experiences, Time since onset of  injury/illness/exacerbation, and 3+ comorbidities: GERD,Chronic sacroiliac joint pain; Anterolisthesis (L2-3 and L4-5); Lumbar facet arthropathy (L2-3, L3-4, L4-5) (Bilateral); Lumbar foraminal stenosis (Right: L2-3) (Bilateral: L4-5); Lumbar lateral recess stenosis (Right: L2-3) (Bilateral: L4-5), HTN are also affecting patient's functional outcome.   REHAB POTENTIAL: Good  CLINICAL DECISION MAKING: Evolving/moderate complexity  EVALUATION COMPLEXITY: Moderate  GOALS: Goals reviewed with patient? No  SHORT TERM GOALS: Target date: 05/15/2024  Patient will be independent with initial home exercise program for self-management of symptoms. Baseline: Initial HEP provided at IE (05/01/24); Goal status: In Progress  LONG TERM GOALS: Target date: 07/24/2024  Patient will be independent with a long-term home exercise program for self-management of symptoms.  Baseline: Initial HEP provided at IE (05/01/24); Goal status: In Progress  2.  Patient will demonstrate improved Lower Extremity Functional Scale (LEFS) to equal or greater than 64/80 to demonstrate improvement in overall condition and self-reported functional ability.  Baseline: 22/80 (05/01/24); Goal status: In Progress  3.  Patient will demonstrate the ability to ambulate 1500 feet with least restrictive assistive device in 6 minutes to demonstrate improved community ambulation closer to age matched norms for healthy community dwelling adult females (1765 feet).  Baseline: to be measured at visit 2 as appropriate (05/01/24); 505' in 4 minute 24 seconds (05/07/24) Goal status: In Progress  4.  Patient will demonstrate full functional hip ROM to be able to get into and out of the car with decreased difficulty. Baseline: PROM measured (see above) (05/01/24); Goal status: In Progress  5.  Patient will demonstrate improvement in Patient Specific Functional Scale (PSFS) of equal or greater than 8/10 points to reflect clinically significant  improvement in patient's most valued functional activities. Baseline: to be measured at visit 2 as appropriate (05/01/24); 0.33/10 (05/07/24) Goal status: In Progress  6.  Patient will report NPRS equal or less than 3/10 during functional activities during the last 2 weeks to improve their abilitly to complete community, work and/or recreational activities with less limitation. Baseline: 7-8/10 (05/01/24); Goal status: In Progress  PLAN:  PT FREQUENCY: 2x/week  PT DURATION: 12 weeks  PLANNED INTERVENTIONS: 97164- PT Re-evaluation, 97750- Physical Performance Testing, 97110-Therapeutic exercises, 97530- Therapeutic activity, [id number]- Neuromuscular re-education, 97535- Self Care, 02859- Manual therapy, [phone]- Gait training, [phone]- Electrical stimulation (unattended), 20560 (1-2 muscles), 20561 (3+ muscles)- Dry Needling, Patient/Family education, Balance training, Joint mobilization, Cryotherapy, and Moist heat  PLAN FOR NEXT SESSION: Continue hip strengthening exercises as well as hip flexor stretching Update HEP as appropriate, Consider anti-lumbar extension exercises.  Manual therapy as need. Consider  traction of hips, piriformis stretching and continued LE strengthening.  Vernell Rise, SPT 05/07/2024, 3:34 PM  Camie SAUNDERS. Juli, PT, DPT, Cert. MDT, PRA-C 05/07/2024, 3:34 PM  [facility] [address] [place], [place] [address] P: [phone] I F: [phone]

## 2024-05-03 NOTE — Telephone Encounter (Addendum)
 Called to notify patient of missed PT visit scheduled at 4:45pm  today. Requested they call back to reschedule, confirm next appointment 05/07/24 at 1:45pm , or let us  know of any changes in PT plans. Let patient know that with any no-show I am required to review our cancellation policy that after 2 no-shows we remove future visits from the schedule, they are responsible to calling in to reschedule, and they will only be able to schedule one appointment at a time and/or we may remove a patient from the schedule.   Patient came in 30 minutes late and apologized for not calling, she took a different route here and encountered a lot of traffic  Vernell Rise, [place] 05/03/2024  [facility] [address] [place], [place] [address] P: [phone] I F: [phone]

## 2024-05-07 ENCOUNTER — Ambulatory Visit: Admitting: Physical Therapy

## 2024-05-07 ENCOUNTER — Encounter: Payer: Self-pay | Admitting: Physical Therapy

## 2024-05-07 VITALS — BP 126/57

## 2024-05-07 DIAGNOSIS — M5459 Other low back pain: Secondary | ICD-10-CM

## 2024-05-07 DIAGNOSIS — M25552 Pain in left hip: Secondary | ICD-10-CM

## 2024-05-07 DIAGNOSIS — M6281 Muscle weakness (generalized): Secondary | ICD-10-CM

## 2024-05-07 DIAGNOSIS — M25551 Pain in right hip: Secondary | ICD-10-CM

## 2024-05-07 NOTE — Therapy (Unsigned)
 OUTPATIENT PHYSICAL THERAPY TREATMENT   Patient Name: Sabrina Castro MRN: [id number] DOB:1960/11/02, 63 y.o., female Today's Date: 05/09/2024  END OF SESSION:  PT End of Session - 05/09/24 1308     Visit Number 3    Number of Visits 25    Date for Recertification  07/24/24    Authorization Type [employer] MEDICARE reporting period from 05/01/2024    Progress Note Due on Visit 10    PT Start Time 1307    PT Stop Time 1349    PT Time Calculation (min) 42 min    Activity Tolerance Patient tolerated treatment well    Behavior During Therapy [facility] for tasks assessed/performed          Past Medical History:  Diagnosis Date   Depression    GERD (gastroesophageal reflux disease)    Hypertension    Past Surgical History:  Procedure Laterality Date   CHOLECYSTECTOMY OPEN     COLONOSCOPY WITH PROPOFOL  N/A 01/09/2016   Procedure: COLONOSCOPY WITH PROPOFOL ;  Surgeon: Gladis RAYMOND Mariner, MD;  Location: [facility] ENDOSCOPY;  Service: Endoscopy;  Laterality: N/A;   ESOPHAGOGASTRODUODENOSCOPY (EGD) WITH PROPOFOL  N/A 01/09/2016   Procedure: ESOPHAGOGASTRODUODENOSCOPY (EGD) WITH PROPOFOL ;  Surgeon: Gladis RAYMOND Mariner, MD;  Location: [facility] ENDOSCOPY;  Service: Endoscopy;  Laterality: N/A;   Patient Active Problem List   Diagnosis Date Noted   Spondylosis without myelopathy or radiculopathy, cervical region 03/27/2018   Cervical facet syndrome (Right) 03/27/2018   Elevated blood sugar level 03/08/2018   Recurrent major depressive disorder, in partial remission 03/08/2018   Cervical radiculitis 01/18/2018   Abnormal x-ray of cervical spine 01/05/2018   DDD (degenerative disc disease), cervical 01/05/2018   Cervicalgia (Right) 01/03/2018   Myofascial pain 01/03/2018   Lumbar facet syndrome (Bilateral) (L>R) 11/17/2017   Chronic low back pain (Primary Area of Pain) (Bilateral) (L>R) w/o sciatica 11/17/2017   Lumbar spine instability 11/07/2017   Anterolisthesis (L2-3 and L4-5) 11/02/2017   Lumbar facet  arthropathy (L2-3, L3-4, L4-5) (Bilateral) 11/02/2017   Lumbar foraminal stenosis (Right: L2-3) (Bilateral: L4-5) 11/02/2017   Lumbar lateral recess stenosis (Right: L2-3) (Bilateral: L4-5) 11/02/2017   Abnormal MRI, lumbar spine 11/02/2017   Osteoarthritis of hips (Bilateral) 11/02/2017   Osteoarthritis of knees (Bilateral) 11/02/2017   Spondylosis without myelopathy or radiculopathy, lumbosacral region 11/02/2017   Other specified dorsopathies, sacral and sacrococcygeal region 11/02/2017   Elevated C-reactive protein (CRP) 11/02/2017   Neurogenic pain 11/02/2017   GERD (gastroesophageal reflux disease) 09/13/2017   Polyneuropathy 09/13/2017   Tachycardia 09/13/2017   Chronic low back pain (Primary Area of Pain) (Bilateral) (Midline) (L>R) w/ sciatica (Bilateral) 09/13/2017   Chronic hip pain (Secondary Area of Pain) (Bilateral) (L>R) 09/13/2017   Chronic lower extremity pain (Tertiary Area of Pain) (Bilateral) (L>R) 09/13/2017   Chronic knee pain (Fourth Area of Pain) (Bilateral) (R>L) 09/13/2017   Chronic pain syndrome 09/13/2017   Long term current use of opiate analgesic 09/13/2017   Pharmacologic therapy 09/13/2017   Disorder of skeletal system 09/13/2017   Problems influencing health status 09/13/2017   Chronic sacroiliac joint pain 09/13/2017   Depression 08/25/2017   DDD (degenerative disc disease), lumbar 07/13/2017   Elevated erythrocyte sedimentation rate 07/13/2017   Numbness and tingling of foot 07/13/2017   Morbid obesity with BMI of 50.0-59.9, adult (HCC) 06/29/2017   Generalized osteoarthritis of multiple sites 05/11/2017   History of tachycardia 05/11/2017   Type 2 diabetes mellitus without complication, without long-term current use of insulin (HCC) 05/11/2017   Morbid obesity  with BMI of 40.0-44.9, adult (HCC) 05/11/2017   Other fecal abnormalities 08/15/2015   Pain in right knee 08/07/2015   Anemia 07/03/2015   Other general symptoms and signs 07/03/2015    Prediabetes 07/03/2015   Allergic rhinitis 08/21/2014   Acute maxillary sinusitis, unspecified 02/19/2014   Other specified anxiety disorders 01/30/2013   Vitamin D  deficiency, unspecified 12/10/2011   Encounter for other general examination 12/06/2011   Encounter for screening mammogram for malignant neoplasm of breast 12/06/2011   Essential (primary) hypertension 12/06/2011   Obesity, unspecified 12/06/2011   Obstructive sleep apnea 12/06/2011   PCP: none  REFERRING PROVIDER: Fleeta Dunning, Rocky, MD  REFERRING DIAG: Chronic hip pain, bilateral  THERAPY DIAG:  Pain in left hip  Pain in right hip  Muscle weakness (generalized)  Other low back pain  Rationale for Evaluation and Treatment: Rehabilitation  ONSET DATE: First onset date in 2019 and in September of 2025 got bad again  SUBJECTIVE:   PERTINENT HISTORY: Patient is a 63 y.o. female who presents to outpatient physical therapy with a referral for medical diagnosis bilateral chronic hip pain. This patient's chief complaints consist of bilateral throbbing, deep hip pain with the L being worse than the R and decreased balance leading to the following functional deficits: decreased ability to stand/walk for prolonged periods of time, work, and difficulty getting into and out of the car. 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), Allergic rhinitis; Anemia; DDD (degenerative disc disease), lumbar; Depression; Elevated erythrocyte sedimentation rate; Essential (primary) hypertension; Generalized osteoarthritis of multiple sites; GERD (gastroesophageal reflux disease); Morbid obesity with BMI of 50.0-59.9, adult (HCC); Numbness and tingling of foot; Obstructive sleep apnea; Other specified anxiety disorders;  Pain in right knee; Polyneuropathy; Prediabetes; Tachycardia; Type 2 diabetes mellitus without complication, without long-term current use of insulin (HCC); Vitamin D  deficiency, unspecified; Chronic low back pain (Primary Area of Pain) (Bilateral) (Midline) (L>R) w/ sciatica (Bilateral); Chronic hip pain (Secondary Area of Pain) (Bilateral) (L>R); Chronic lower extremity pain (Tertiary Area of Pain) (Bilateral) (L>R); Chronic knee pain (Fourth Area of Pain) (Bilateral) (R>L); Chronic pain syndrome; Long term current use of opiate analgesic; Chronic sacroiliac joint pain; Anterolisthesis (L2-3 and L4-5); Lumbar facet arthropathy (L2-3, L3-4, L4-5) (Bilateral); Lumbar foraminal stenosis (Right: L2-3) (Bilateral: L4-5); Lumbar lateral recess stenosis (Right: L2-3) (Bilateral: L4-5); Osteoarthritis of hips (Bilateral); Osteoarthritis of knees (Bilateral); Spondylosis without myelopathy or radiculopathy, lumbosacral region; Other specified dorsopathies, sacral and sacrococcygeal region; Elevated C-reactive protein (CRP); Neurogenic pain; Lumbar spine instability; Lumbar facet syndrome (Bilateral) (L>R); Chronic low back pain (Primary Area of Pain) (Bilateral) (L>R) w/o sciatica; Cervicalgia (Right); Myofascial pain; DDD (degenerative disc disease), cervical; Cervical radiculitis; Elevated blood sugar level; Recurrent major depressive disorder, in partial remission; Spondylosis without myelopathy or radiculopathy, cervical region; and Cervical facet syndrome (Right) on their problem list.  she  has a past surgical history that includes Cholecystectomy open. Patient denies hx of cancer, stroke, seizures, lung problems, unexplained changes in bowel or bladder problems, unexplained stumbling or dropping things, and spinal surgery  SUBJECTIVE STATEMENT: Patient stated she felt good after last session and thinks the soreness may be from overdoing It at home. Has been dealing with some insurance issues and may not be  covered for the month of January.   PAIN:   NPRS: Current: denies pain  today just feels stiff in both hips  From Initial Eval: Are you having pain? Yes: NPRS scale: 1/10  Pain location: deep in her hip joints, lateral side  Pain description: grinding, throbbing, feels like a broken a bone (L side) Worst: 7-8/10 Aggravating factors: bending over, getting in and out of the car (R leg), standing long periods time Best: 0/10 Relieving factors: nerve ablations and sitting  PRECAUTIONS: fall  RED FLAGS: None   WEIGHT BEARING RESTRICTIONS: No  FALLS: Has patient fallen in last 6 months? No  OCCUPATION: on disability since 2019 from spinal stenosis.  PLOF: Independent with community mobility with device (cane around the house when hips are very bad) (rollator when going out and isn't sure how long she has to be standing for)  PATIENT GOALS: better balance and walk further without pain  NEXT MD VISIT: upon completion of PT  OBJECTIVE:                                                                                                 TREATMENT :    Therapeutic exercise: therapeutic exercises that incorporate ONE parameter at one or more areas of the body to centralize symptoms, develop strength and endurance, range of motion, and flexibility required for successful completion of functional activities.   Standing hip flexor stretch with R UE support on counter top   2 x 15 seconds each side  Supine B piriformis   1 x 15 seconds  Standing resisted hip abduction with RTB to improve lateral hip strength   2 x 10  Neuromuscular Re-education: a technique or exercise performed with the goal of improving the level of communication between the body and the brain, such as for balance, motor control, muscle activation patterns, coordination, desensitization, quality of muscle contraction, proprioception, and/or kinesthetic sense needed for successful and safe completion of functional activities.     Hooklying PPT to improve lumbopelvic control   2 x 10   Added to HEP  Hooklying PPT with B arm raise with cane for improved lumbopelvic control with external pertubation   2 x 10   Cueing for patient to keep back flat on table    Added to HEP  Hooklying PPT bridges for glute strengthening and lumbo pelvic control   2 x 10  Standing marches with UE support on elevated mat table and cued to keep pelvis in neutral to improve lumbo pelvic control   2 x 10 each leg   PATIENT EDUCATION:  Education details: Exercise purpose/form. Self management techniques. Education on diagnosis, prognosis, POC, anatomy and physiology of current condition. Education on HEP including handout. Person educated: Patient Education method: Explanation, Demonstration, Verbal cues, and Handouts Education comprehension: verbalized understanding, returned demonstration, verbal cues required, and needs further education  HOME EXERCISE PROGRAM: Access Code: [email or web address] URL: [email or web address] Date: 05/09/2024 Prepared by: Vernell Rise  Exercises - Standing Hip Abduction with Resistance at Ankles and Counter Support  - 1 x daily - 2 sets - 10 reps - Supine Bridge  - 1 x daily - 2 sets - 10 reps - Standing Hip Flexor  Stretch  - 1 x daily - 2 sets - 15 second hold - Supine Posterior Pelvic Tilt  - 1 x daily - 2 sets - 10 reps - Supine Posterior Pelvic Tilt with Shoulder Flexion  - 1 x daily - 2 sets - 10 reps  ASSESSMENT:  CLINICAL IMPRESSION: Added hooklying PPT to help improve lumbopelvic control and patient noted some initial discomfort with beginning this exercise that worked its way out with increased reps. Added B arm raises to PPT and patient noted increased discomfort in the R shoulder, added a cane with cueing to use here L arm to push her R arm back. Patient noted decreased shoulder pain when completing with the cane. Patient noted some fatigue in her thighs and knees with bridges.  Cued to drive through her heels and lift butt off the mat just to where it was tolerable. Completed piriformis stretching in supine and patient noted her L side was significantly tighter than her R. Patient was able to self cue to stand up tall and not lean away from the leg she was lifting up. Patient noted she could feel the L hip working when standing on it to lift the R leg, felt harder to stand on the L leg compared to her R. Added standing marches with UE support on mat table with a neutral pelvis to strengthen LE and improve lumbo pelvic control. Patient noted no pain upon completion of session. Patient will benefit from skilled physical therapy intervention to address current body structure impairments and activity limitations to improve function and work towards goals set in current POC in order to return to prior level of function or maximal functional improvement.   From Initial Eval: Patient is a 63 y.o. female referred to outpatient physical therapy with a medical diagnosis of chronic bilateral hip pain who presents with signs and symptoms consistent with potential R sided FAI and potential labral involvement as well L sided gluteal tendinopathy. A lateral catch during the FABER and FADIR test on the R side with a catch suggests femoracetabular impingement however the location of the catch can suggest labral involvement or snapping hip syndrome. The negative external derotation test on the R can suggest an absence of gluteal tendinopathy, however the location of pain suggests otherwise. Will complete a scour test next visit to determine if there is labral involvement on the R side. Will also complete hip traction to determine if the symptoms are extra-articular vs intra-articular on both hips. Patient has a history of chronic low back and SI joint pain which could also be the cause of some of her hip pain. Patient presents with significant ROM, strength, gait and balance impairments that are limiting  ability to complete stand and walk for long periods of time, get into and out of the car and work without difficulty. Patient will benefit from skilled physical therapy intervention to address current body structure impairments and activity limitations to improve function and work towards goals set in current POC in order to return to prior level of function or maximal functional improvement.   OBJECTIVE IMPAIRMENTS: Abnormal gait, decreased activity tolerance, decreased balance, decreased endurance, decreased knowledge of condition, decreased mobility, difficulty walking, decreased strength, obesity, and pain.   ACTIVITY LIMITATIONS: carrying, lifting, bending, standing, squatting, stairs, dressing, locomotion level, and getting into and out of the car  PARTICIPATION LIMITATIONS: cleaning, laundry, shopping, community activity, occupation, and getting in and out of the car  PERSONAL FACTORS: Past/current experiences, Time since onset of injury/illness/exacerbation,  and 3+ comorbidities: GERD,Chronic sacroiliac joint pain; Anterolisthesis (L2-3 and L4-5); Lumbar facet arthropathy (L2-3, L3-4, L4-5) (Bilateral); Lumbar foraminal stenosis (Right: L2-3) (Bilateral: L4-5); Lumbar lateral recess stenosis (Right: L2-3) (Bilateral: L4-5), HTN are also affecting patient's functional outcome.   REHAB POTENTIAL: Good  CLINICAL DECISION MAKING: Evolving/moderate complexity  EVALUATION COMPLEXITY: Moderate  GOALS: Goals reviewed with patient? No  SHORT TERM GOALS: Target date: 05/15/2024  Patient will be independent with initial home exercise program for self-management of symptoms. Baseline: Initial HEP provided at IE (05/01/24); Goal status: In Progress  LONG TERM GOALS: Target date: 07/24/2024  Patient will be independent with a long-term home exercise program for self-management of symptoms.  Baseline: Initial HEP provided at IE (05/01/24); Goal status: In Progress  2.  Patient will demonstrate  improved Lower Extremity Functional Scale (LEFS) to equal or greater than 64/80 to demonstrate improvement in overall condition and self-reported functional ability.  Baseline: 22/80 (05/01/24); Goal status: In Progress  3.  Patient will demonstrate the ability to ambulate 1500 feet with least restrictive assistive device in 6 minutes to demonstrate improved community ambulation closer to age matched norms for healthy community dwelling adult females (1765 feet).  Baseline: to be measured at visit 2 as appropriate (05/01/24); 505' in 4 minute 24 seconds (05/07/24) Goal status: In Progress  4.  Patient will demonstrate full functional hip ROM to be able to get into and out of the car with decreased difficulty. Baseline: PROM measured (see above) (05/01/24); Goal status: In Progress  5.  Patient will demonstrate improvement in Patient Specific Functional Scale (PSFS) of equal or greater than 8/10 points to reflect clinically significant improvement in patient's most valued functional activities. Baseline: to be measured at visit 2 as appropriate (05/01/24); 0.33/10 (05/07/24) Goal status: In Progress  6.  Patient will report NPRS equal or less than 3/10 during functional activities during the last 2 weeks to improve their abilitly to complete community, work and/or recreational activities with less limitation. Baseline: 7-8/10 (05/01/24); Goal status: In Progress  PLAN:  PT FREQUENCY: 2x/week  PT DURATION: 12 weeks  PLANNED INTERVENTIONS: 97164- PT Re-evaluation, 97750- Physical Performance Testing, 97110-Therapeutic exercises, 97530- Therapeutic activity, [id number]- Neuromuscular re-education, 97535- Self Care, [id number]- Manual therapy, [id number]- Gait training, [phone]- Electrical stimulation (unattended), 20560 (1-2 muscles), 20561 (3+ muscles)- Dry Needling, Patient/Family education, Balance training, Joint mobilization, Cryotherapy, and Moist heat  PLAN FOR NEXT SESSION: Consider lateral hip  traction. Continue hip strengthening exercises as well as hip flexor stretching Update HEP as appropriate. Manual therapy as need.   Vernell Rise, SPT 05/09/2024, 5:47 PM  Student physical therapist under direct supervision of licensed physical therapists during the entirety of the session.   Camie SAUNDERS. Juli, PT, DPT, Cert. MDT, PRA-C 05/09/2024, 5:48 PM  [facility] [address] [place], [place] [address] P: [phone] I F: [phone]

## 2024-05-09 ENCOUNTER — Ambulatory Visit: Admitting: Physical Therapy

## 2024-05-09 ENCOUNTER — Encounter: Payer: Self-pay | Admitting: Physical Therapy

## 2024-05-09 DIAGNOSIS — M25551 Pain in right hip: Secondary | ICD-10-CM

## 2024-05-09 DIAGNOSIS — M6281 Muscle weakness (generalized): Secondary | ICD-10-CM

## 2024-05-09 DIAGNOSIS — M25552 Pain in left hip: Secondary | ICD-10-CM

## 2024-05-09 DIAGNOSIS — M5459 Other low back pain: Secondary | ICD-10-CM

## 2024-05-14 ENCOUNTER — Ambulatory Visit: Admitting: Physical Therapy

## 2024-05-23 ENCOUNTER — Ambulatory Visit: Admitting: Physical Therapy

## 2024-05-28 ENCOUNTER — Ambulatory Visit: Payer: Self-pay | Admitting: Physical Therapy

## 2024-05-30 ENCOUNTER — Ambulatory Visit: Admitting: Physical Therapy

## 2024-05-31 ENCOUNTER — Ambulatory Visit: Payer: Self-pay | Admitting: Physical Therapy

## 2024-06-04 ENCOUNTER — Ambulatory Visit: Admitting: Physical Therapy

## 2024-06-05 ENCOUNTER — Ambulatory Visit: Payer: Self-pay | Admitting: Physical Therapy

## 2024-06-06 ENCOUNTER — Ambulatory Visit: Admitting: Physical Therapy

## 2024-06-11 ENCOUNTER — Ambulatory Visit: Admitting: Physical Therapy

## 2024-06-13 ENCOUNTER — Ambulatory Visit: Admitting: Physical Therapy

## 2024-06-18 ENCOUNTER — Ambulatory Visit: Admitting: Physical Therapy

## 2024-06-20 ENCOUNTER — Ambulatory Visit: Admitting: Physical Therapy
